# Patient Record
Sex: Male | Born: 1995 | Race: Black or African American | Hispanic: No | Smoking: Current every day smoker
Health system: Southern US, Community
[De-identification: ages and names within clinical notes are randomized; demographics above are authoritative.]

## PROBLEM LIST (undated history)

## (undated) ENCOUNTER — Ambulatory Visit: Payer: Self-pay | Source: Home / Self Care

## (undated) DIAGNOSIS — R569 Unspecified convulsions: Secondary | ICD-10-CM

---
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
## 2013-02-26 ENCOUNTER — Emergency Department (INDEPENDENT_AMBULATORY_CARE_PROVIDER_SITE_OTHER)
Admission: EM | Admit: 2013-02-26 | Discharge: 2013-02-26 | Disposition: A | Discharge status: Home / Self Care | Payer: Medicaid Other | Source: Home / Self Care | Attending: Family Medicine | Admitting: Family Medicine

## 2013-02-26 ENCOUNTER — Encounter (HOSPITAL_COMMUNITY): Payer: Self-pay | Admitting: Emergency Medicine

## 2013-02-26 ENCOUNTER — Emergency Department (INDEPENDENT_AMBULATORY_CARE_PROVIDER_SITE_OTHER): Payer: Medicaid Other

## 2013-02-26 DIAGNOSIS — H6120 Impacted cerumen, unspecified ear: Secondary | ICD-10-CM

## 2013-02-26 DIAGNOSIS — J111 Influenza due to unidentified influenza virus with other respiratory manifestations: Secondary | ICD-10-CM

## 2013-02-26 DIAGNOSIS — H6121 Impacted cerumen, right ear: Secondary | ICD-10-CM

## 2013-02-26 DIAGNOSIS — R69 Illness, unspecified: Principal | ICD-10-CM

## 2013-02-26 MED ORDER — IPRATROPIUM BROMIDE 0.06 % NA SOLN: 2.0000 | Freq: Four times a day (QID) | NASAL | Status: DC

## 2013-02-26 MED ORDER — HYDROCODONE-ACETAMINOPHEN 5-325 MG PO TABS: 0.5000 | ORAL_TABLET | Freq: Every evening | ORAL | Status: DC | PRN

## 2013-02-26 MED ORDER — ONDANSETRON HCL 8 MG PO TABS: 8.0000 mg | ORAL_TABLET | Freq: Three times a day (TID) | ORAL | Status: DC | PRN

## 2013-02-26 NOTE — ED Provider Notes (Signed)
Christian Arias is a 18 y.o. male who presents to Urgent Care today for 5-7 days of congestion cough nausea vomiting diarrhea fevers and chills and body aches. He has tried over-the-counter medications which seem to help. He is eating and drinking well.   History reviewed. No pertinent past medical history. History  Substance Use Topics  . Smoking status: Current Every Day Smoker  . Smokeless tobacco: Not on file  . Alcohol Use: No   ROS as above Medications reviewed. No current facility-administered medications for this encounter.   Current Outpatient Prescriptions  Medication Sig Dispense Refill  . methylphenidate (CONCERTA) 27 MG CR tablet Take 27 mg by mouth every morning.      Marland Kitchen. HYDROcodone-acetaminophen (NORCO/VICODIN) 5-325 MG per tablet Take 0.5 tablets by mouth at bedtime as needed (cough).  6 tablet  0  . ipratropium (ATROVENT) 0.06 % nasal spray Place 2 sprays into both nostrils 4 (four) times daily.  15 mL  1  . ondansetron (ZOFRAN) 8 MG tablet Take 1 tablet (8 mg total) by mouth every 8 (eight) hours as needed for nausea or vomiting.  20 tablet  0    Exam:  BP 108/71  Pulse 73  Temp(Src) 98.3 F (36.8 C) (Oral)  Resp 18  Ht 5' 8.25" (1.734 m)  Wt 136 lb (61.689 kg)  BMI 20.52 kg/m2  SpO2 96% Gen: Well NAD HEENT: EOMI,  MMM history of rash with cobblestoning. Left tympanic membrane is normal appearing. Right is occluded with cerumen. Lungs: Normal work of breathing. CTABL Heart: RRR no MRG Abd: NABS, Soft. NT, ND Exts: Non edematous BL  LE, warm and well perfused.   The majority of the right cerumen was removed however patient continued to have some cerumen impaction. He felt somewhat better.  No results found for this or any previous visit (from the past 24 hour(s)). Dg Chest 2 View  02/26/2013   CLINICAL DATA:  Cough, congestion, fever  EXAM: CHEST  2 VIEW  COMPARISON:  04/16/2012  FINDINGS: The heart size and mediastinal contours are within normal limits.  Both lungs are clear. The visualized skeletal structures are unremarkable.  IMPRESSION: No active cardiopulmonary disease.   Electronically Signed   By: Ruel Favorsrevor  Shick M.D.   On: 02/26/2013 11:59    Assessment and Plan: 18 y.o. male with influenza-like illness and cerumen impaction. Plan treatment with hydrocodone-based cough medication, Atrovent nasal spray. Additionally recommend Debrox ear drops as needed. Follow up with primary care provider.  Discussed warning signs or symptoms. Please see discharge instructions. Patient expresses understanding.    Rodolph BongEvan S Sevin Farone, MD 02/26/13 [phone]

## 2013-02-26 NOTE — ED Notes (Signed)
C/o flu like syx x past 5 days; cough, congestion, vomiting, loose stools; NAD; minimal relief w OTC medications

## 2013-02-26 NOTE — Discharge Instructions (Signed)
Thank you for coming in today. Take up to 2 Aleve twice daily for pain fevers chills body aches or sore throat or headache. Use codeine containing cough medication especially at bedtime. Use Atrovent nasal spray. Use Zofran as needed for vomiting. Use Debrox eardrops to help get the wax out of your ear Call or go to the emergency room if you get worse, have trouble breathing, have chest pains, or palpitations.

## 2014-02-24 ENCOUNTER — Other Ambulatory Visit (HOSPITAL_COMMUNITY): Payer: Self-pay | Admitting: *Deleted

## 2014-02-24 ENCOUNTER — Ambulatory Visit (HOSPITAL_COMMUNITY)
Admission: RE | Admit: 2014-02-24 | Discharge: 2014-02-24 | Disposition: A | Discharge status: Home / Self Care | Payer: Medicaid Other | Source: Ambulatory Visit | Attending: *Deleted | Admitting: *Deleted

## 2014-02-24 DIAGNOSIS — M25561 Pain in right knee: Secondary | ICD-10-CM

## 2014-04-26 ENCOUNTER — Emergency Department (HOSPITAL_COMMUNITY)
Admission: EM | Admit: 2014-04-26 | Discharge: 2014-04-26 | Disposition: A | Discharge status: Home / Self Care | Payer: Medicaid Other | Attending: Emergency Medicine | Admitting: Emergency Medicine

## 2014-04-26 ENCOUNTER — Encounter (HOSPITAL_COMMUNITY): Payer: Self-pay

## 2014-04-26 DIAGNOSIS — J320 Chronic maxillary sinusitis: Secondary | ICD-10-CM | POA: Insufficient documentation

## 2014-04-26 DIAGNOSIS — M791 Myalgia: Secondary | ICD-10-CM | POA: Diagnosis not present

## 2014-04-26 DIAGNOSIS — Z79899 Other long term (current) drug therapy: Secondary | ICD-10-CM | POA: Insufficient documentation

## 2014-04-26 DIAGNOSIS — Z720 Tobacco use: Secondary | ICD-10-CM | POA: Insufficient documentation

## 2014-04-26 DIAGNOSIS — M79641 Pain in right hand: Secondary | ICD-10-CM | POA: Diagnosis not present

## 2014-04-26 DIAGNOSIS — M1990 Unspecified osteoarthritis, unspecified site: Secondary | ICD-10-CM | POA: Insufficient documentation

## 2014-04-26 DIAGNOSIS — M779 Enthesopathy, unspecified: Secondary | ICD-10-CM

## 2014-04-26 DIAGNOSIS — R51 Headache: Secondary | ICD-10-CM | POA: Diagnosis present

## 2014-04-26 DIAGNOSIS — H9191 Unspecified hearing loss, right ear: Secondary | ICD-10-CM | POA: Insufficient documentation

## 2014-04-26 DIAGNOSIS — H9201 Otalgia, right ear: Secondary | ICD-10-CM | POA: Diagnosis not present

## 2014-04-26 MED ORDER — GUAIFENESIN-CODEINE 100-10 MG/5ML PO SYRP: 10.0000 mL | ORAL_SOLUTION | Freq: Three times a day (TID) | ORAL | Status: DC | PRN

## 2014-04-26 MED ORDER — PSEUDOEPHEDRINE HCL 60 MG PO TABS: 60.0000 mg | ORAL_TABLET | Freq: Four times a day (QID) | ORAL | Status: DC | PRN

## 2014-04-26 MED ORDER — NAPROXEN 500 MG PO TABS: 500.0000 mg | ORAL_TABLET | Freq: Two times a day (BID) | ORAL | Status: DC

## 2014-04-26 NOTE — ED Notes (Signed)
Can't hear out of right ear, sinus congestion, and right hand pain w/o injury. "I can't work with this"

## 2014-04-26 NOTE — Discharge Instructions (Signed)
Repetitive Strain Injuries  Repetitive strain injuries (RSIs) result from overuse or misuse of soft tissues including muscles, tendons, or nerves. Tendons are the cord-like structures that attach muscles to bones. RSIs can affect almost any part of the body. However, RSIs are most common in the arms (thumbs, wrists, elbows, shoulders) and legs (ankles, knees). Common medical conditions that are often caused by repetitive strain include carpal tunnel syndrome, tennis or golfer's elbow, bursitis, and tendonitis. If RSIs are treated early, and therepeated activity is reduced or removed, the severity and length of your problems can usually be reduced. RSIs are also called cumulative trauma disorders (CTD).   CAUSES   Many RSIs occur due to repeating the same activity at work over weeks or months without sufficient rest, such as prolonged typing. RSIs also commonly occur when a hobby or sport is done repeatedly without sufficient rest. RSIs can also occur due to repeated strain or stress on a body part in someone who has one or more risk factors for RSIs.  RISK FACTORS  Workplace risk factors   Frequent computer use, especially if your workstation is not adjusted for your body type.   Infrequent rest breaks.   Working in a high-pressure environment.   Working at a fast pace.   Repeating the same motion, such as frequent typing.   Working in an awkward position or holding the same position for a long time.   Forceful movements such as lifting, pulling, or pushing.   Vibration caused by using power tools.   Working in cold temperatures.   Job stress.  Personal risk factors   Poor posture.   Being loose-jointed.   Not exercising regularly.   Being overweight.   Arthritis, diabetes, thyroid problems, or other long-term (chronic)medical conditions.   Vitamin deficiencies.   Keeping your fingernails long.   An unhealthy, stressful, or inactive lifestyle.   Not sleeping well.  SYMPTOMS   Symptoms often  begin at work but become more noticeable after the repeated stress has ended. For example, you may develop fatigue or soreness in your wrist while typingat work, and at night you may develop numbness and tingling in your fingers. Common symptoms include:    Burning, shooting, or aching pain, especially in the fingers, palms, wrists, forearms, or shoulders.   Tenderness.   Swelling.   Tingling, numbness, or loss of feeling.   Pain with certain activities, such as turning a doorknob or reaching above your head.   Weakness, heaviness, or loss of coordination in yourhand.   Muscle spasms or tightness.  In some cases, symptoms can become so intense that it is difficult to perform everyday tasks. Symptoms that do not improve with rest may indicate a more serious condition.   DIAGNOSIS   Your caregiver may determine the type ofRSI you have based on your medical evaluation and a description of your activities.   TREATMENT   Treatment depends on the severity and type of RSI you have. Your caregiver may recommend rest for the affected body part, medicines, and physical or occupational therapy to reduce pain, swelling, and soreness. Discuss the activities you do repeatedly with your caregiver. Your caregiver can help you decide whether you need to change your activities. An RSI may take months or years to heal, especially if the affected body part gets insufficient rest. In some cases, such as severe carpal tunnel syndrome, surgery may be recommended.  PREVENTION   Talk with your supervisor to make sure you have the proper equipment   cushion in the curve of your lower back.  Shoulders and arms relaxed and at your sides.  Neck relaxed and not bent forwards or backwards.  Your desk and computer workstation  properly adjusted to your body type.  Your chair adjusted so there is no excess pressure on the back of your thighs.  The keyboard resting above your thighs. You should be able to reach the keys with your elbows at your side, bent at a right angle. Your arms should be supported on forearm rests, with your forearms parallel to the ground.  The computer mouse within easy reach.  The monitor directly in front of you, so that your eyes are aligned with the top of the screen. The screen should be about 15 to 25 inches from your eyes.  While typing, keep your wrist straight, in a neutral position. Move your entire arm when you move your mouse or when typing hard-to-reach keys.  Only use your computer as much as you need to for work. Do not use it during breaks.  Take breaks often from any repeated activity. Alternate with another task which requires you to use different muscles, or rest at least once every hour.  Change positions regularly. If you spend a lot of time sitting, get up, walk around, and stretch.  Do not hold pens or pencils tightly when writing.  Exercise regularly.  Maintain a normal weight.  Eat a diet with plenty of vegetables, whole grains, and fruit.  Get sufficient, restful sleep. HOME CARE INSTRUCTIONS  If your caregiver prescribed medicine to help reduce swelling, take it as directed.  Only take over-the-counter or prescription medicines for pain, discomfort, or fever as directed by your caregiver.  Reduce, and if needed, stopthe activities that are causing your problems until you have no further symptoms.If your symptoms are work-related, you may need to talk to your supervisor about changing your activities.  When symptoms develop, put ice or a cold pack on the aching area.  Put ice in a plastic bag.  Place a towel between your skin and the bag.  Leave the ice on for 15-20 minutes.  If you were given a splint to keep your wrist from bending, wear it as  instructed. It is important to wear the splint at night. Use the splint for as long as your caregiver recommends. SEEK MEDICAL CARE IF:  You develop new problems.  Your problems do not get better with medicine. MAKE SURE YOU:  Understand these instructions.  Will watch your condition.  Will get help right away if you are not doing well or get worse. Document Released: 01/27/2002 Document Revised: 08/08/2011 Document Reviewed: 03/30/2011 [facility] Patient Information 2015 [place], [place]. This information is not intended to replace advice given to you by your health care provider. Make sure you discuss any questions you have with your health care provider.  Sinusitis Sinusitis is redness, soreness, and puffiness (inflammation) of the air pockets in the bones of your face (sinuses). The redness, soreness, and puffiness can cause air and mucus to get trapped in your sinuses. This can allow germs to grow and cause an infection.  HOME CARE   Drink enough fluids to keep your pee (urine) clear or pale yellow.  Use a humidifier in your home.  Run a hot shower to create steam in the bathroom. Sit in the bathroom with the door closed. Breathe in the steam 3-4 times a day.  Put a warm, moist washcloth on your face 3-4 times a  day, or as told by your doctor.  Use salt water sprays (saline sprays) to wet the thick fluid in your nose. This can help the sinuses drain.  Only take medicine as told by your doctor. GET HELP RIGHT AWAY IF:   Your pain gets worse.  You have very bad headaches.  You are sick to your stomach (nauseous).  You throw up (vomit).  You are very sleepy (drowsy) all the time.  Your face is puffy (swollen).  Your vision changes.  You have a stiff neck.  You have trouble breathing. MAKE SURE YOU:   Understand these instructions.  Will watch your condition.  Will get help right away if you are not doing well or get worse. Document Released: 07/26/2007 Document  Revised: 11/01/2011 Document Reviewed: 09/12/2011 [facility] Patient Information 2015 [place], [place]. This information is not intended to replace advice given to you by your health care provider. Make sure you discuss any questions you have with your health care provider.

## 2014-04-28 NOTE — ED Provider Notes (Signed)
CSN: [id number]     Arrival date & time 04/26/14  2155 History   First MD Initiated Contact with Patient 04/26/14 2224     Chief Complaint  Patient presents with  . Otalgia  . Facial Pain  . Hand Pain     (Consider location/radiation/quality/duration/timing/severity/associated sxs/prior Treatment) HPI   Christian Arias is a 19 y.o. male who presents to the Emergency Department complaining of ear pain, nasla congestion, sinus pressure and generalized body aches.  States his symptoms have been present for 3-4 days.  He describes a "pressure" to his face and difficulty breathing from his nose.  He also states that he has pain to his right ear and decreased hearing.  He denies fever, chills, cough, shortness or breath or dizziness.   He also c/o pain to his right hand for several days.  States that he uses his hands frequently at his job.  Pain is worse with gripping.  He denies known injury, swelling, numbness or discoloration.      History reviewed. No pertinent past medical history. History reviewed. No pertinent past surgical history. No family history on file. History  Substance Use Topics  . Smoking status: Current Every Day Smoker -- 0.50 packs/day    Types: Cigarettes  . Smokeless tobacco: Not on file  . Alcohol Use: No    Review of Systems  Constitutional: Negative for fever, chills, activity change and appetite change.  HENT: Positive for congestion, ear pain, hearing loss, rhinorrhea and sinus pressure. Negative for facial swelling, sore throat and trouble swallowing.   Eyes: Negative for visual disturbance.  Respiratory: Negative for cough, shortness of breath, wheezing and stridor.   Gastrointestinal: Negative for nausea, vomiting and abdominal pain.  Musculoskeletal: Positive for myalgias and arthralgias (right hand pain). Negative for neck pain and neck stiffness.  Skin: Negative.   Neurological: Negative for dizziness, weakness, numbness and headaches.   Hematological: Negative for adenopathy.  Psychiatric/Behavioral: Negative for confusion.  All other systems reviewed and are negative.     Allergies  Review of patient's allergies indicates no known allergies.  Home Medications   Prior to Admission medications   Medication Sig Start Date End Date Taking? Authorizing Provider  guaiFENesin-codeine (ROBITUSSIN AC) 100-10 MG/5ML syrup Take 10 mLs by mouth 3 (three) times daily as needed. 04/26/14   Tammi Reid Nawrot, PA-C  HYDROcodone-acetaminophen (NORCO/VICODIN) 5-325 MG per tablet Take 0.5 tablets by mouth at bedtime as needed (cough). 02/26/13   Rodolph Bong, MD  ipratropium (ATROVENT) 0.06 % nasal spray Place 2 sprays into both nostrils 4 (four) times daily. 02/26/13   Rodolph Bong, MD  methylphenidate (CONCERTA) 27 MG CR tablet Take 27 mg by mouth every morning.    Historical Provider, MD  naproxen (NAPROSYN) 500 MG tablet Take 1 tablet (500 mg total) by mouth 2 (two) times daily with a meal. 04/26/14   Tammi Macayla Ekdahl, PA-C  ondansetron (ZOFRAN) 8 MG tablet Take 1 tablet (8 mg total) by mouth every 8 (eight) hours as needed for nausea or vomiting. 02/26/13   Rodolph Bong, MD  pseudoephedrine (SUDAFED) 60 MG tablet Take 1 tablet (60 mg total) by mouth every 6 (six) hours as needed for congestion. 04/26/14   Tammi Dennison Mcdaid, PA-C   BP 110/56 mmHg  Pulse 60  Temp(Src) 98.3 F (36.8 C) (Oral)  Resp 24  Ht 5' 8.25" (1.734 m)  Wt 135 lb (61.236 kg)  BMI 20.37 kg/m2  SpO2 100% Physical Exam  Constitutional: He is oriented  to person, place, and time. He appears well-developed and well-nourished. No distress.  HENT:  Head: Normocephalic and atraumatic.  Right Ear: Tympanic membrane and ear canal normal.  Left Ear: Tympanic membrane and ear canal normal.  Nose: Mucosal edema and rhinorrhea present. Right sinus exhibits maxillary sinus tenderness. Left sinus exhibits maxillary sinus tenderness.  Mouth/Throat: Uvula is midline and mucous membranes are  normal. No trismus in the jaw. No uvula swelling. No oropharyngeal exudate, posterior oropharyngeal edema, posterior oropharyngeal erythema or tonsillar abscesses.  Eyes: Conjunctivae are normal.  Neck: Normal range of motion and phonation normal. Neck supple. No Brudzinski's sign and no Kernig's sign noted.  Cardiovascular: Normal rate, regular rhythm, normal heart sounds and intact distal pulses.   No murmur heard. Pulmonary/Chest: Effort normal and breath sounds normal. No respiratory distress. He has no wheezes. He has no rales.  Abdominal: Soft. He exhibits no distension. There is no tenderness. There is no rebound and no guarding.  Musculoskeletal: He exhibits no edema.  Tender along the dorsal right hand along the fourth and fifth metacarpals.  No erythema, edema or excessive warm.  Pt has full ROM of the fingers.  Distal sensation intact.  CR< 2 sec  Lymphadenopathy:    He has no cervical adenopathy.  Neurological: He is alert and oriented to person, place, and time. He exhibits normal muscle tone. Coordination normal.  Skin: Skin is warm and dry.  Nursing note and vitals reviewed.   ED Course  Procedures (including critical care time) Labs Review Labs Reviewed - No data to display  Imaging Review No results found.   EKG Interpretation None      MDM   Final diagnoses:  Maxillary sinusitis, unspecified chronicity  Tendonitis    Pt is well appearing, non-toxic.  No concerning sx for infectious process of the hand., NV intact. Sx's likely related to excessive use.  Pt agrees to symptomatic tx and close f/u with ortho if needed.      Severiano Gilbertammi Cheron Pasquarelli, PA-C 04/28/14 1949  Bethann BerkshireJoseph Zammit, MD 04/29/14 [phone]

## 2014-05-09 ENCOUNTER — Encounter (HOSPITAL_COMMUNITY): Payer: Self-pay | Admitting: Cardiology

## 2014-05-09 ENCOUNTER — Emergency Department (HOSPITAL_COMMUNITY)
Admission: EM | Admit: 2014-05-09 | Discharge: 2014-05-09 | Disposition: A | Discharge status: Home / Self Care | Payer: Medicaid Other | Attending: Emergency Medicine | Admitting: Emergency Medicine

## 2014-05-09 DIAGNOSIS — R197 Diarrhea, unspecified: Secondary | ICD-10-CM | POA: Diagnosis not present

## 2014-05-09 DIAGNOSIS — Z79899 Other long term (current) drug therapy: Secondary | ICD-10-CM | POA: Diagnosis not present

## 2014-05-09 DIAGNOSIS — R112 Nausea with vomiting, unspecified: Secondary | ICD-10-CM | POA: Diagnosis not present

## 2014-05-09 DIAGNOSIS — Z720 Tobacco use: Secondary | ICD-10-CM | POA: Diagnosis not present

## 2014-05-09 DIAGNOSIS — R1110 Vomiting, unspecified: Secondary | ICD-10-CM | POA: Diagnosis present

## 2014-05-09 LAB — BASIC METABOLIC PANEL
Anion gap: 11 (ref 5–15)
BUN: 20 mg/dL (ref 6–23)
CHLORIDE: 105 mmol/L (ref 96–112)
CO2: 24 mmol/L (ref 19–32)
CREATININE: 0.86 mg/dL (ref 0.50–1.35)
Calcium: 9.6 mg/dL (ref 8.4–10.5)
GFR calc Af Amer: >90 mL/min (ref >90)
Glucose, Bld: 81 mg/dL (ref 70–99)
POTASSIUM: 4.3 mmol/L (ref 3.5–5.1)
SODIUM: 140 mmol/L (ref 135–145)

## 2014-05-09 LAB — CBC WITH DIFFERENTIAL/PLATELET
BAND NEUTROPHILS: 0 % (ref 0–10)
BASOS ABS: 0.1 10*3/uL (ref 0.0–0.1)
BLASTS: 0 %
Basophils Relative: 1 % (ref 0–1)
Eosinophils Absolute: 0 10*3/uL (ref 0.0–0.7)
Eosinophils Relative: 0 % (ref 0–5)
HEMATOCRIT: 44.4 % (ref 39.0–52.0)
HEMOGLOBIN: 15.7 g/dL (ref 13.0–17.0)
Lymphocytes Relative: 4 % — ABNORMAL LOW (ref 12–46)
Lymphs Abs: 0.5 10*3/uL — ABNORMAL LOW (ref 0.7–4.0)
MCH: 31.6 pg (ref 26.0–34.0)
MCHC: 35.4 g/dL (ref 30.0–36.0)
MCV: 89.3 fL (ref 78.0–100.0)
MONOS PCT: 2 % — AB (ref 3–12)
MYELOCYTES: 0 %
Metamyelocytes Relative: 0 %
Monocytes Absolute: 0.2 10*3/uL (ref 0.1–1.0)
Neutro Abs: 10.7 10*3/uL — ABNORMAL HIGH (ref 1.7–7.7)
Neutrophils Relative %: 93 % — ABNORMAL HIGH (ref 43–77)
PROMYELOCYTES ABS: 0 %
Platelets: 234 10*3/uL (ref 150–400)
RBC: 4.97 MIL/uL (ref 4.22–5.81)
RDW: 13.2 % (ref 11.5–15.5)
WBC: 11.5 10*3/uL — ABNORMAL HIGH (ref 4.0–10.5)
nRBC: 0 /100 WBC

## 2014-05-09 MED ORDER — SODIUM CHLORIDE 0.9 % IV BOLUS (SEPSIS)
1000.0000 mL | Freq: Once | INTRAVENOUS | Status: AC
  Administered 2014-05-09: 1000 mL via INTRAVENOUS

## 2014-05-09 MED ORDER — PROMETHAZINE HCL 25 MG PO TABS: 25.0000 mg | ORAL_TABLET | Freq: Four times a day (QID) | ORAL | Status: DC | PRN

## 2014-05-09 MED ORDER — ONDANSETRON 4 MG PO TBDP
4.0000 mg | ORAL_TABLET | Freq: Once | ORAL | Status: AC
  Administered 2014-05-09: 4 mg via ORAL

## 2014-05-09 MED ORDER — NAPROXEN 500 MG PO TABS: 500.0000 mg | ORAL_TABLET | Freq: Two times a day (BID) | ORAL | Status: DC

## 2014-05-09 MED ORDER — ONDANSETRON HCL 4 MG/2ML IJ SOLN
4.0000 mg | Freq: Once | INTRAMUSCULAR | Status: AC
  Administered 2014-05-09: 4 mg via INTRAVENOUS
  Filled 2014-05-09: qty 2

## 2014-05-09 MED ORDER — ONDANSETRON 4 MG PO TBDP
ORAL_TABLET | ORAL | Status: AC
  Filled 2014-05-09: qty 1

## 2014-05-09 MED ORDER — KETOROLAC TROMETHAMINE 30 MG/ML IJ SOLN
30.0000 mg | Freq: Once | INTRAMUSCULAR | Status: AC
  Administered 2014-05-09: 30 mg via INTRAVENOUS
  Filled 2014-05-09: qty 1

## 2014-05-09 NOTE — Discharge Instructions (Signed)
Please call your doctor for a followup appointment within 24-48 hours. When you talk to your doctor please let them know that you were seen in the emergency department and have them acquire all of your records so that they can discuss the findings with you and formulate a treatment plan to fully care for your new and ongoing problems. ° °[facility] Doctor List ° ° ° °Edward Hawkins MD. Specialty: Pulmonary Disease Contact information: [address]  °[address]  °[place] [address]  °[phone]  ° °Margaret Simpson, MD. Specialty: Family Medicine Contact information: [address]  °[place] [address]  °[phone]  ° °Scott Luking, MD. Specialty: Family Medicine Contact information: [address]  °[address]  °[place] [address]  °[phone]  ° °Tesfaye Fanta, MD Specialty: Internal Medicine Contact information: [address]  °[place] [address]  °[phone]  ° °Zach Hall, MD. Specialty: Internal Medicine Contact information: [address]  °[place] [address]  °[phone]  ° °Angus Mcinnis, MD. Specialty: Family Medicine Contact information: [address]  °[place] [address]  °[phone]  ° °Stephen Knowlton, MD. Specialty: Family Medicine Contact information: [address]  °[address]  °[place] [address]  °[phone]  ° °Roy Fagan, MD. Specialty: Internal Medicine Contact information: [address]  °[address]  °[place] [address]  °[phone]  ° ° °

## 2014-05-09 NOTE — ED Notes (Signed)
Vomiting since 11 am.  States he sees blood in his vomit.

## 2014-05-09 NOTE — ED Notes (Signed)
Pt alert & oriented x4, stable gait. Patient given discharge instructions, paperwork & prescription(s). Patient  instructed to stop at the registration desk to finish any additional paperwork. Patient verbalized understanding. Pt left department w/ no further questions. 

## 2014-05-09 NOTE — ED Provider Notes (Signed)
CSN: [id number]     Arrival date & time 05/09/14  1812 History   First MD Initiated Contact with Patient 05/09/14 2044     Chief Complaint  Patient presents with  . Emesis     (Consider location/radiation/quality/duration/timing/severity/associated sxs/prior Treatment) HPI Comments: The patient is a 19 year old male, no significant past medical history, presents with approximately 3 days of loose and watery stools and one day of vomiting which started this morning. He has vomited approximately 6 times, nothing makes this better or worse, no associated fever, he does have associated abdominal cramping which is diffuse and nonfocal. The diarrhea has now stopped, he has no peripheral edema, no fevers chills coughing shortness of breath or back pain. 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 He denies any travel, recent antibiotics or other sick contacts.  Patient is a 19 y.o. male presenting with vomiting. The history is provided by the patient.  Emesis   History reviewed. No pertinent past medical history. History reviewed. No pertinent past surgical history. History reviewed. No pertinent family history. History  Substance Use Topics  . Smoking status: Current Every Day Smoker -- 0.50 packs/day    Types: Cigarettes  . Smokeless tobacco: Not on file  . Alcohol Use: No    Review of Systems  Gastrointestinal: Positive for vomiting.  All other systems reviewed and are negative.     Allergies  Review of patient's allergies indicates no known allergies.  Home Medications   Prior to Admission medications   Medication Sig Start Date End Date Taking? Authorizing Provider  ibuprofen (ADVIL,MOTRIN) 800 MG tablet Take 800 mg by mouth daily as needed for moderate pain.   Yes Historical Provider, MD  tiZANidine (ZANAFLEX) 4 MG tablet Take 4 mg by mouth every 6 (six) hours as needed for muscle spasms.   Yes  Historical Provider, MD  guaiFENesin-codeine (ROBITUSSIN AC) 100-10 MG/5ML syrup Take 10 mLs by mouth 3 (three) times daily as needed. Patient not taking: Reported on 05/09/2014 04/26/14   Severiano Gilbert, PA-C  HYDROcodone-acetaminophen (NORCO/VICODIN) 5-325 MG per tablet Take 0.5 tablets by mouth at bedtime as needed (cough). Patient not taking: Reported on 05/09/2014 02/26/13   Rodolph Bong, MD  ipratropium (ATROVENT) 0.06 % nasal spray Place 2 sprays into both nostrils 4 (four) times daily. Patient not taking: Reported on 05/09/2014 02/26/13   Rodolph Bong, MD  naproxen (NAPROSYN) 500 MG tablet Take 1 tablet (500 mg total) by mouth 2 (two) times daily with a meal. 05/09/14   Eber Hong, MD  ondansetron (ZOFRAN) 8 MG tablet Take 1 tablet (8 mg total) by mouth every 8 (eight) hours as needed for nausea or vomiting. Patient not taking: Reported on 05/09/2014 02/26/13   Rodolph Bong, MD  promethazine (PHENERGAN) 25 MG tablet Take 1 tablet (25 mg total) by mouth every 6 (six) hours as needed for nausea or vomiting. 05/09/14   Eber Hong, MD  pseudoephedrine (SUDAFED) 60 MG tablet Take 1 tablet (60 mg total) by mouth every 6 (six) hours as needed for congestion. Patient not taking: Reported on 05/09/2014 04/26/14   Tammi Triplett, PA-C   BP 116/68 mmHg  Pulse 65  Temp(Src) 98.3 F (36.8 C) (Oral)  Resp 14  Ht  (1.727 m)  Wt 135 lb (61.236 kg)  BMI 20.53 kg/m2  SpO2 100% Physical Exam  Constitutional: He appears well-developed and well-nourished. No distress.  HENT:  Head: Normocephalic and atraumatic.  Mouth/Throat: Oropharynx is clear and moist. No oropharyngeal exudate.  Eyes: Conjunctivae and EOM are normal. Pupils are equal, round, and reactive to light. Right eye exhibits no discharge. Left eye exhibits no discharge. No scleral icterus.  Neck: Normal range of motion. Neck supple. No JVD present. No thyromegaly present.  Cardiovascular: Normal rate, regular rhythm, normal heart sounds and  intact distal pulses.  Exam reveals no gallop and no friction rub.   No murmur heard. Pulmonary/Chest: Effort normal and breath sounds normal. No respiratory distress. He has no wheezes. He has no rales.  Abdominal: Soft. Bowel sounds are normal. He exhibits no distension and no mass. There is tenderness.  Musculoskeletal: Normal range of motion. He exhibits no edema or tenderness.  Lymphadenopathy:    He has no cervical adenopathy.  Neurological: He is alert. Coordination normal.  Skin: Skin is warm and dry. No rash noted. No erythema.  Psychiatric: He has a normal mood and affect. His behavior is normal.  Nursing note and vitals reviewed.   ED Course  Procedures (including critical care time) Labs Review Labs Reviewed  CBC WITH DIFFERENTIAL/PLATELET - Abnormal; Notable for the following:    WBC 11.5 (*)    Neutrophils Relative % 93 (*)    Lymphocytes Relative 4 (*)    Monocytes Relative 2 (*)    Neutro Abs 10.7 (*)    Lymphs Abs 0.5 (*)    All other components within normal limits  BASIC METABOLIC PANEL    Imaging Review No results found.    MDM   Final diagnoses:  Nausea vomiting and diarrhea    Well appaering other than some dehydradtion - likely norovirus.  CBC with mild leukocytosis, BMP normal - VS normal.  toradol zofran fluids  Improved, ready for d/c.  Labs reassuring, mild WBC elevation.    Eber HongBrian Marquese Burkland, MD 05/09/14 [phone]

## 2015-11-12 ENCOUNTER — Encounter (HOSPITAL_COMMUNITY): Payer: Self-pay | Admitting: *Deleted

## 2015-11-12 ENCOUNTER — Emergency Department (HOSPITAL_COMMUNITY)
Admission: EM | Admit: 2015-11-12 | Discharge: 2015-11-12 | Disposition: A | Discharge status: Home / Self Care | Payer: Medicaid Other | Attending: Emergency Medicine | Admitting: Emergency Medicine

## 2015-11-12 ENCOUNTER — Emergency Department (HOSPITAL_COMMUNITY): Payer: Medicaid Other

## 2015-11-12 DIAGNOSIS — J9801 Acute bronchospasm: Secondary | ICD-10-CM

## 2015-11-12 DIAGNOSIS — J069 Acute upper respiratory infection, unspecified: Secondary | ICD-10-CM | POA: Insufficient documentation

## 2015-11-12 DIAGNOSIS — Z79899 Other long term (current) drug therapy: Secondary | ICD-10-CM | POA: Insufficient documentation

## 2015-11-12 DIAGNOSIS — F17210 Nicotine dependence, cigarettes, uncomplicated: Secondary | ICD-10-CM | POA: Insufficient documentation

## 2015-11-12 MED ORDER — KETOROLAC TROMETHAMINE 30 MG/ML IJ SOLN
30.0000 mg | Freq: Once | INTRAMUSCULAR | Status: AC
  Administered 2015-11-12: 30 mg via INTRAVENOUS
  Filled 2015-11-12: qty 1

## 2015-11-12 MED ORDER — PREDNISONE 20 MG PO TABS: 60.0000 mg | ORAL_TABLET | Freq: Every day | ORAL | 0 refills | Status: DC

## 2015-11-12 MED ORDER — ONDANSETRON 4 MG PO TBDP: 4.0000 mg | ORAL_TABLET | Freq: Three times a day (TID) | ORAL | 0 refills | Status: DC | PRN

## 2015-11-12 MED ORDER — ALBUTEROL SULFATE HFA 108 (90 BASE) MCG/ACT IN AERS
2.0000 | INHALATION_SPRAY | Freq: Once | RESPIRATORY_TRACT | Status: AC
  Administered 2015-11-12: 2 via RESPIRATORY_TRACT
  Filled 2015-11-12: qty 6.7

## 2015-11-12 MED ORDER — BENZONATATE 100 MG PO CAPS: 100.0000 mg | ORAL_CAPSULE | Freq: Three times a day (TID) | ORAL | 0 refills | Status: DC | PRN

## 2015-11-12 MED ORDER — SODIUM CHLORIDE 0.9 % IV BOLUS (SEPSIS)
1000.0000 mL | Freq: Once | INTRAVENOUS | Status: AC
  Administered 2015-11-12: 1000 mL via INTRAVENOUS

## 2015-11-12 MED ORDER — ONDANSETRON HCL 4 MG/2ML IJ SOLN
4.0000 mg | Freq: Once | INTRAMUSCULAR | Status: AC
  Administered 2015-11-12: 4 mg via INTRAVENOUS
  Filled 2015-11-12: qty 2

## 2015-11-12 NOTE — ED Triage Notes (Signed)
Pt c/o chest tightness and shortness of breath x 3 days. Wheezing noted bilaterally. Pt speaking in full sentences. Pt given 125mg  solumedrol,10mg  albuterol, and 0.5mg  atrovent by EMS

## 2015-11-12 NOTE — Discharge Instructions (Addendum)
You may alternate between Tylenol 1000 mg every 6 hours as needed for fever and pain and ibuprofen 800 mg every 8 hours as needed for fever and pain. Please rest and drink plenty of fluids. Your x-ray showed no pneumonia. I do not feel you need to be on antibiotics. This is likely a viral illness. You may use your inhaler 2-4 puffs every 2-4 hours as needed for shortness of breath, wheezing.   To find a primary care or specialty doctor please call [phone] or [phone] to access "[place] Find a Doctor Service."  You may also go on the [facility] website at [email or web address]  There are also multiple [facility] and [facility] practices throughout the [place] that are frequently accepting new patients. You may find a clinic that is close to your home and contact them.  [facility] -  [address] [place] [phone]  [facility] in [place] (also locations in [place] and [place]) -  [address] [place] [address] [phone]  [facility] -  [address] [place] [address] [phone]

## 2015-11-12 NOTE — ED Provider Notes (Signed)
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 Has also had nausea but no vomiting or diarrhea. Denies sick contacts or recent travel. States he started having sharp central chest pain today without radiation that lasted for several seconds and then resolve. It concerned him because he had a family history of CAD; 911. With EMS, patient received 10 mg of albuterol, 0.5 mg of Atrovent, 125 mg of IV Solu-Medrol.  He reports he is already feeling better.  ROS: See HPI Constitutional:  fever  Eyes: no drainage  ENT: no runny nose   Cardiovascular:   chest pain  Resp:  SOB  GI: no vomiting GU: no dysuria Integumentary: no rash  Allergy: no hives  Musculoskeletal: no leg swelling  Neurological: no slurred speech ROS otherwise negative  PAST MEDICAL HISTORY/PAST SURGICAL HISTORY:  History reviewed. No pertinent past medical history.  MEDICATIONS:  Prior to Admission medications   Medication Sig Start Date End Date Taking? Authorizing Provider  guaiFENesin-codeine (ROBITUSSIN AC) 100-10 MG/5ML syrup Take 10 mLs by mouth 3 (three) times daily as needed. Patient not taking: Reported on 05/09/2014 04/26/14   Tammy Triplett, PA-C  HYDROcodone-acetaminophen (NORCO/VICODIN) 5-325 MG per tablet Take 0.5 tablets by mouth at bedtime as needed (cough). Patient not taking: Reported on 05/09/2014 02/26/13   Rodolph BongEvan S Corey, MD  ibuprofen (ADVIL,MOTRIN) 800 MG tablet Take 800 mg by mouth daily as needed for moderate pain.    Historical Provider, MD  ipratropium (ATROVENT) 0.06 % nasal spray Place 2 sprays into both nostrils 4 (four) times  daily. Patient not taking: Reported on 05/09/2014 02/26/13   Rodolph BongEvan S Corey, MD  naproxen (NAPROSYN) 500 MG tablet Take 1 tablet (500 mg total) by mouth 2 (two) times daily with a meal. 05/09/14   Eber HongBrian Miller, MD  ondansetron (ZOFRAN) 8 MG tablet Take 1 tablet (8 mg total) by mouth every 8 (eight) hours as needed for nausea or vomiting. Patient not taking: Reported on 05/09/2014 02/26/13   Rodolph BongEvan S Corey, MD  promethazine (PHENERGAN) 25 MG tablet Take 1 tablet (25 mg total) by mouth every 6 (six) hours as needed for nausea or vomiting. 05/09/14   Eber HongBrian Miller, MD  pseudoephedrine (SUDAFED) 60 MG tablet Take 1 tablet (60 mg total) by mouth every 6 (six) hours as needed for congestion. Patient not taking: Reported on 05/09/2014 04/26/14   Tammy Triplett, PA-C  tiZANidine (ZANAFLEX) 4 MG tablet Take 4 mg by mouth every 6 (six) hours as needed for muscle spasms.    Historical Provider, MD    ALLERGIES:  No Known Allergies  SOCIAL HISTORY:  Social History  Substance Use Topics  . Smoking status: Current Every Day Smoker    Packs/day: 0.50    Types: Cigarettes  . Smokeless tobacco: Never Used  . Alcohol use No    FAMILY HISTORY: No family history on file.  EXAM: BP 120/63 (BP Location: Right Arm)   Pulse 105   Temp 98.7 F (37.1 C) (Oral)   Resp 18   SpO2 100%  CONSTITUTIONAL: Alert and oriented and responds appropriately to questions. Well-appearing; well-nourished HEAD: Normocephalic EYES: Conjunctivae clear, PERRL ENT: normal nose; no rhinorrhea; moist mucous membranes;  No pharyngeal erythema or petechiae, no tonsillar hypertrophy or exudate, no uvular deviation, no trismus or drooling, normal phonation, no stridor, no dental caries present, no drainable dental abscess noted, no Ludwig's angina, tongue sits flat in the bottom of the mouth, no angioedema, no facial erythema or warmth, no facial swelling NECK: Supple, no meningismus, no LAD  CARD: RRR; S1 and S2 appreciated; no murmurs, no  clicks, no rubs, no gallops RESP: Normal chest excursion without splinting or tachypnea; breath sounds clear and equal bilaterally; no wheezes, no rhonchi, no rales, no hypoxia or respiratory distress, speaking full sentences ABD/GI: Normal bowel sounds; non-distended; soft, non-tender, no rebound, no guarding, no peritoneal signs BACK:  The back appears normal and is non-tender to palpation, there is no CVA tenderness EXT: Normal ROM in all joints; non-tender to palpation; no edema; normal capillary refill; no cyanosis, no calf tenderness or swelling    SKIN: Normal color for age and race; warm; no rash NEURO: Moves all extremities equally, sensation to light touch intact diffusely, cranial nerves II through XII intact PSYCH: The patient's mood and manner are appropriate. Grooming and personal hygiene are appropriate.  MEDICAL DECISION MAKING: Patient here with likely viral upper respiratory infection. He is having some bronchospasm that has improved with albuterol, Atrovent and Solu-Medrol with EMS. Reports previous history of bronchitis, bronchospasm. Does not use an albuterol inhaler at home. We'll treat symptomatically with Toradol, Zofran, IV fluids. EKG shows sinus arrhythmia with no other acute abnormality. He does not have risk factors for ACS other family history. Doubt PE or dissection. Will obtain a chest x-ray to evaluate for infiltrate.  Doubt meningitis or other life-threatening illness.  ED PROGRESS: Patient's lungs are now clear to auscultation. No hypoxia. Chest x-ray shows no infiltrate or edema. No pneumothorax. I do not feel he needs to be on antibiotics. We'll discharge her with albuterol inhaler. We'll discharge with prednisone burst, Tessalon Perles and instructions to alternate Tylenol and Motrin. We'll discharge with prescription for Zofran. We'll give him outpatient PCP follow-up. Discussed return precautions. He is comfortable with this plan.   At this time, I do not feel  there is any life-threatening condition present. I have reviewed and discussed all results (EKG, imaging, lab, urine as appropriate), exam findings with patient/family. I have reviewed nursing notes and appropriate previous records.  I feel the patient is safe to be discharged home without further emergent workup and can continue workup as an outpatient as needed. Discussed usual and customary return precautions. Patient/family verbalize understanding and are comfortable with this plan.  Outpatient follow-up has been provided. All questions have been answered.      EKG Interpretation  Date/Time:  Friday November 12 2015 05:46:06 EDT Ventricular Rate:  102 PR Interval:    QRS Duration: 89 QT Interval:  331 QTC Calculation: 432 R Axis:   86 Text Interpretation:  Fast sinus arrhythmia Right atrial enlargement Borderline T wave abnormalities No significant change since last tracing Confirmed by WARD,  DO, KRISTEN [phone]) on 11/12/2015 6:03:47 AM         Layla Maw Ward, DO 11/12/15 6213

## 2015-12-05 ENCOUNTER — Emergency Department (HOSPITAL_COMMUNITY): Payer: Medicaid Other

## 2015-12-05 ENCOUNTER — Emergency Department (HOSPITAL_COMMUNITY)
Admission: EM | Admit: 2015-12-05 | Discharge: 2015-12-05 | Disposition: A | Discharge status: Home / Self Care | Payer: Medicaid Other | Attending: Emergency Medicine | Admitting: Emergency Medicine

## 2015-12-05 ENCOUNTER — Encounter (HOSPITAL_COMMUNITY): Payer: Self-pay | Admitting: Emergency Medicine

## 2015-12-05 DIAGNOSIS — R079 Chest pain, unspecified: Secondary | ICD-10-CM | POA: Insufficient documentation

## 2015-12-05 DIAGNOSIS — Z5321 Procedure and treatment not carried out due to patient leaving prior to being seen by health care provider: Secondary | ICD-10-CM | POA: Insufficient documentation

## 2015-12-05 LAB — CBC
HCT: 38.9 % — ABNORMAL LOW (ref 39.0–52.0)
Hemoglobin: 13.5 g/dL (ref 13.0–17.0)
MCH: 31.2 pg (ref 26.0–34.0)
MCHC: 34.7 g/dL (ref 30.0–36.0)
MCV: 89.8 fL (ref 78.0–100.0)
PLATELETS: 232 10*3/uL (ref 150–400)
RBC: 4.33 MIL/uL (ref 4.22–5.81)
RDW: 13.8 % (ref 11.5–15.5)
WBC: 9.1 10*3/uL (ref 4.0–10.5)

## 2015-12-05 LAB — BASIC METABOLIC PANEL
Anion gap: 8 (ref 5–15)
BUN: 15 mg/dL (ref 6–20)
CALCIUM: 9.4 mg/dL (ref 8.9–10.3)
CHLORIDE: 103 mmol/L (ref 101–111)
CO2: 26 mmol/L (ref 22–32)
CREATININE: 0.98 mg/dL (ref 0.61–1.24)
Glucose, Bld: 87 mg/dL (ref 65–99)
Potassium: 3.3 mmol/L — ABNORMAL LOW (ref 3.5–5.1)
SODIUM: 137 mmol/L (ref 135–145)

## 2015-12-05 LAB — I-STAT TROPONIN, ED: TROPONIN I, POC: 0.01 ng/mL (ref 0.00–0.08)

## 2015-12-05 NOTE — ED Triage Notes (Signed)
Pt. \arrived with EMS from home reports intermittent central chest pain with mild SOB for 3 weeks , headache yesterday and anxiety attack today . Denies emesis or diaphoresis .

## 2015-12-05 NOTE — ED Notes (Signed)
Pt moved to room. Patient states "I just want this m----- f----- out my arm so I can go and those m----- f------ out there said I can't get it out until I get back here". MD made aware of pt's wishes as well as CN.

## 2016-03-15 ENCOUNTER — Encounter (HOSPITAL_COMMUNITY): Payer: Self-pay | Admitting: Emergency Medicine

## 2016-03-15 DIAGNOSIS — H6121 Impacted cerumen, right ear: Secondary | ICD-10-CM | POA: Insufficient documentation

## 2016-03-15 DIAGNOSIS — B349 Viral infection, unspecified: Secondary | ICD-10-CM | POA: Insufficient documentation

## 2016-03-15 DIAGNOSIS — J069 Acute upper respiratory infection, unspecified: Secondary | ICD-10-CM | POA: Insufficient documentation

## 2016-03-15 DIAGNOSIS — F17210 Nicotine dependence, cigarettes, uncomplicated: Secondary | ICD-10-CM | POA: Insufficient documentation

## 2016-03-15 DIAGNOSIS — Z79899 Other long term (current) drug therapy: Secondary | ICD-10-CM | POA: Insufficient documentation

## 2016-03-15 LAB — CBC WITH DIFFERENTIAL/PLATELET
Basophils Absolute: 0.1 10*3/uL (ref 0.0–0.1)
Basophils Relative: 1 %
EOS ABS: 0.5 10*3/uL (ref 0.0–0.7)
EOS PCT: 5 %
HCT: 41 % (ref 39.0–52.0)
Hemoglobin: 14.2 g/dL (ref 13.0–17.0)
LYMPHS ABS: 1.8 10*3/uL (ref 0.7–4.0)
LYMPHS PCT: 21 %
MCH: 30.9 pg (ref 26.0–34.0)
MCHC: 34.6 g/dL (ref 30.0–36.0)
MCV: 89.1 fL (ref 78.0–100.0)
MONO ABS: 0.6 10*3/uL (ref 0.1–1.0)
MONOS PCT: 7 %
Neutro Abs: 5.6 10*3/uL (ref 1.7–7.7)
Neutrophils Relative %: 66 %
PLATELETS: 268 10*3/uL (ref 150–400)
RBC: 4.6 MIL/uL (ref 4.22–5.81)
RDW: 13.4 % (ref 11.5–15.5)
WBC: 8.5 10*3/uL (ref 4.0–10.5)

## 2016-03-15 LAB — I-STAT CHEM 8, ED
BUN: 15 mg/dL (ref 6–20)
Calcium, Ion: 1.16 mmol/L (ref 1.15–1.40)
Chloride: 102 mmol/L (ref 101–111)
Creatinine, Ser: 0.9 mg/dL (ref 0.61–1.24)
GLUCOSE: 107 mg/dL — AB (ref 65–99)
HCT: 45 % (ref 39.0–52.0)
Hemoglobin: 15.3 g/dL (ref 13.0–17.0)
Potassium: 3.4 mmol/L — ABNORMAL LOW (ref 3.5–5.1)
SODIUM: 142 mmol/L (ref 135–145)
TCO2: 25 mmol/L (ref 0–100)

## 2016-03-15 NOTE — ED Triage Notes (Addendum)
Patient with generalized body aches, cough and coughing up yellow phlegm.  No fever at this time.  Patient states that it started yesterday.  Having some right ear pain and can not hear well.  Patient has been vomiting today, last time around 1800.

## 2016-03-16 ENCOUNTER — Emergency Department (HOSPITAL_COMMUNITY)
Admission: EM | Admit: 2016-03-16 | Discharge: 2016-03-16 | Disposition: A | Discharge status: Home / Self Care | Payer: Medicaid Other | Attending: Emergency Medicine | Admitting: Emergency Medicine

## 2016-03-16 DIAGNOSIS — B349 Viral infection, unspecified: Secondary | ICD-10-CM

## 2016-03-16 DIAGNOSIS — H6121 Impacted cerumen, right ear: Secondary | ICD-10-CM

## 2016-03-16 HISTORY — DX: Unspecified convulsions: R56.9

## 2016-03-16 NOTE — ED Provider Notes (Signed)
[facility] Provider Note   CSN: [id number] Arrival date & time: 03/15/16  2150     History   Chief Complaint Chief Complaint  Patient presents with  . Generalized Body Aches    HPI Christian Arias is a 21 y.o. male.  Patient complains of sore throat, body aches, headache x 1-2 days. He also complains of decreased, muffled hearing on the right. No ear pain. He is not aware of any fever. No diarrhea, but he reports emesis x 2 without further nausea currently. He has had a productive cough today.    The history is provided by the patient. No language interpreter was used.    Past Medical History:  Diagnosis Date  . Seizures (HCC)     There are no active problems to display for this patient.   History reviewed. No pertinent surgical history.     Home Medications    Prior to Admission medications   Medication Sig Start Date End Date Taking? Authorizing Provider  benzonatate (TESSALON) 100 MG capsule Take 1 capsule (100 mg total) by mouth 3 (three) times daily as needed for cough. 11/12/15   Kristen N Ward, DO  ondansetron (ZOFRAN ODT) 4 MG disintegrating tablet Take 1 tablet (4 mg total) by mouth every 8 (eight) hours as needed for nausea or vomiting. 11/12/15   Kristen N Ward, DO  predniSONE (DELTASONE) 20 MG tablet Take 3 tablets (60 mg total) by mouth daily. 11/12/15   Layla MawKristen N Ward, DO    Family History History reviewed. No pertinent family history.  Social History Social History  Substance Use Topics  . Smoking status: Current Every Day Smoker    Packs/day: 0.50    Types: Cigarettes  . Smokeless tobacco: Never Used  . Alcohol use No     Allergies   Ibuprofen and Tramadol   Review of Systems Review of Systems  Constitutional: Positive for appetite change. Negative for chills and fever.  HENT: Positive for sore throat.        See HPI. C/O right ear hearing change.  Respiratory: Positive for cough. Negative for shortness of breath.     Cardiovascular: Negative.  Negative for chest pain.  Gastrointestinal: Positive for vomiting. Negative for abdominal pain.  Musculoskeletal: Positive for myalgias.  Skin: Negative.  Negative for rash.  Neurological: Negative.  Negative for weakness.     Physical Exam Updated Vital Signs BP 113/69 (BP Location: Right Arm)   Pulse 83   Temp 99 F (37.2 C) (Oral)   Resp 17   SpO2 100%   Physical Exam  Constitutional: He is oriented to person, place, and time. He appears well-developed and well-nourished.  HENT:  Head: Normocephalic.  Mouth/Throat: Oropharynx is clear and moist.  Right sided cerumen impaction.  Eyes: Conjunctivae are normal.  Neck: Normal range of motion. Neck supple.  Cardiovascular: Normal rate and regular rhythm.   No murmur heard. Pulmonary/Chest: Effort normal and breath sounds normal. He has no wheezes. He has no rales.  Abdominal: Soft. Bowel sounds are normal. There is no tenderness. There is no rebound and no guarding.  Musculoskeletal: Normal range of motion.  Neurological: He is alert and oriented to person, place, and time.  Skin: Skin is warm and dry. No rash noted.  Psychiatric: He has a normal mood and affect.     ED Treatments / Results  Labs (all labs ordered are listed, but only abnormal results are displayed) Labs Reviewed  I-STAT CHEM 8, ED - Abnormal; Notable  for the following:       Result Value   Potassium 3.4 (*)    Glucose, Bld 107 (*)    All other components within normal limits  CBC WITH DIFFERENTIAL/PLATELET   Results for orders placed or performed during the hospital encounter of 03/16/16  CBC with Differential  Result Value Ref Range   WBC 8.5 4.0 - 10.5 K/uL   RBC 4.60 4.22 - 5.81 MIL/uL   Hemoglobin 14.2 13.0 - 17.0 g/dL   HCT 16.1 09.6 - 04.5 %   MCV 89.1 78.0 - 100.0 fL   MCH 30.9 26.0 - 34.0 pg   MCHC 34.6 30.0 - 36.0 g/dL   RDW 40.9 81.1 - 91.4 %   Platelets 268 150 - 400 K/uL   Neutrophils Relative % 66 %    Neutro Abs 5.6 1.7 - 7.7 K/uL   Lymphocytes Relative 21 %   Lymphs Abs 1.8 0.7 - 4.0 K/uL   Monocytes Relative 7 %   Monocytes Absolute 0.6 0.1 - 1.0 K/uL   Eosinophils Relative 5 %   Eosinophils Absolute 0.5 0.0 - 0.7 K/uL   Basophils Relative 1 %   Basophils Absolute 0.1 0.0 - 0.1 K/uL  I-Stat Chem 8, ED  Result Value Ref Range   Sodium 142 135 - 145 mmol/L   Potassium 3.4 (L) 3.5 - 5.1 mmol/L   Chloride 102 101 - 111 mmol/L   BUN 15 6 - 20 mg/dL   Creatinine, Ser 7.82 0.61 - 1.24 mg/dL   Glucose, Bld 956 (H) 65 - 99 mg/dL   Calcium, Ion 2.13 0.86 - 1.40 mmol/L   TCO2 25 0 - 100 mmol/L   Hemoglobin 15.3 13.0 - 17.0 g/dL   HCT 57.8 46.9 - 62.9 %   EKG  EKG Interpretation None       Radiology No results found.  Procedures Procedures (including critical care time)  Medications Ordered in ED Medications - No data to display   Initial Impression / Assessment and Plan / ED Course  I have reviewed the triage vital signs and the nursing notes.  Pertinent labs & imaging results that were available during my care of the patient were reviewed by me and considered in my medical decision making (see chart for details).     Patient with symptoms of viral URI - afebrile cough, sore throat, body aches. He is well appearing, non-toxic. VSS, afebrile.   Right ear lavage attempted for relief of symptoms. He can be discharged home with supportive care instructions.   Final Clinical Impressions(s) / ED Diagnoses   Final diagnoses:  None   1. Viral URI 2. Pharyngitis  New Prescriptions New Prescriptions   No medications on file     Elpidio Anis, Cordelia Poche 03/16/16 0111    Dione Booze, MD 03/16/16 [phone]

## 2016-04-14 ENCOUNTER — Ambulatory Visit (HOSPITAL_COMMUNITY)
Admission: EM | Admit: 2016-04-14 | Discharge: 2016-04-14 | Disposition: A | Discharge status: Home / Self Care | Payer: BLUE CROSS/BLUE SHIELD | Attending: Family Medicine | Admitting: Family Medicine

## 2016-04-14 ENCOUNTER — Encounter (HOSPITAL_COMMUNITY): Payer: Self-pay | Admitting: Emergency Medicine

## 2016-04-14 DIAGNOSIS — J029 Acute pharyngitis, unspecified: Secondary | ICD-10-CM | POA: Diagnosis not present

## 2016-04-14 MED ORDER — ONDANSETRON 4 MG PO TBDP
ORAL_TABLET | ORAL | Status: AC
  Filled 2016-04-14: qty 1

## 2016-04-14 MED ORDER — AZITHROMYCIN 250 MG PO TABS: ORAL_TABLET | ORAL | 0 refills | Status: DC

## 2016-04-14 MED ORDER — IPRATROPIUM BROMIDE 0.06 % NA SOLN: 2.0000 | Freq: Four times a day (QID) | NASAL | 2 refills | Status: DC

## 2016-04-14 MED ORDER — ONDANSETRON 4 MG PO TBDP
4.0000 mg | ORAL_TABLET | Freq: Once | ORAL | Status: AC
  Administered 2016-04-14: 4 mg via ORAL

## 2016-04-14 NOTE — Discharge Instructions (Signed)
Drink plenty of fluids as discussed, use medicine as prescribed, and mucinex or delsym for cough. Return or see your doctor if further problems °

## 2016-04-14 NOTE — ED Provider Notes (Signed)
[facility]    CSN: [id number] Arrival date & time: 04/14/16  1128     History   Chief Complaint Chief Complaint  Patient presents with  . Sore Throat    HPI Christian Arias is a 21 y.o. male.   The history is provided by the patient.  Sore Throat  This is a new problem. The current episode started more than 2 days ago. The problem has been gradually worsening. Pertinent negatives include no chest pain and no abdominal pain. The symptoms are aggravated by swallowing.    Past Medical History:  Diagnosis Date  . Seizures (HCC)     There are no active problems to display for this patient.   History reviewed. No pertinent surgical history.     Home Medications    Prior to Admission medications   Medication Sig Start Date End Date Taking? Authorizing Provider  azithromycin (ZITHROMAX Z-PAK) 250 MG tablet Take as directed on pack 04/14/16   Linna Hoff, MD  benzonatate (TESSALON) 100 MG capsule Take 1 capsule (100 mg total) by mouth 3 (three) times daily as needed for cough. 11/12/15   Kristen N Ward, DO  ipratropium (ATROVENT) 0.06 % nasal spray Place 2 sprays into both nostrils 4 (four) times daily. 04/14/16   Linna Hoff, MD  ondansetron (ZOFRAN ODT) 4 MG disintegrating tablet Take 1 tablet (4 mg total) by mouth every 8 (eight) hours as needed for nausea or vomiting. 11/12/15   Kristen N Ward, DO  predniSONE (DELTASONE) 20 MG tablet Take 3 tablets (60 mg total) by mouth daily. 11/12/15   Layla Maw Ward, DO    Family History History reviewed. No pertinent family history.  Social History Social History  Substance Use Topics  . Smoking status: Current Every Day Smoker    Packs/day: 0.50    Types: Cigarettes  . Smokeless tobacco: Never Used  . Alcohol use No     Allergies   Ibuprofen and Tramadol   Review of Systems Review of Systems  Constitutional: Negative.   HENT: Positive for congestion, postnasal drip, rhinorrhea and sore throat.     Respiratory: Negative.   Cardiovascular: Negative.  Negative for chest pain.  Gastrointestinal: Positive for vomiting. Negative for abdominal pain.  Genitourinary: Negative.   All other systems reviewed and are negative.    Physical Exam Triage Vital Signs ED Triage Vitals  Enc Vitals Group     BP 04/14/16 1148 102/56     Pulse Rate 04/14/16 1148 79     Resp 04/14/16 1148 18     Temp 04/14/16 1148 97.9 F (36.6 C)     Temp Source 04/14/16 1148 Oral     SpO2 04/14/16 1148 100 %     Weight --      Height --      Head Circumference --      Peak Flow --      Pain Score 04/14/16 1150 9     Pain Loc --      Pain Edu? --      Excl. in GC? --    No data found.   Updated Vital Signs BP 102/56 (BP Location: Left Arm)   Pulse 79   Temp 97.9 F (36.6 C) (Oral)   Resp 18   SpO2 100%   Visual Acuity Right Eye Distance:   Left Eye Distance:   Bilateral Distance:    Right Eye Near:   Left Eye Near:    Bilateral Near:  Physical Exam  Constitutional: He appears well-developed and well-nourished.  HENT:  Right Ear: External ear normal.  Left Ear: External ear normal.  Nose: Nose normal.  Neck: Normal range of motion. Neck supple.  Cardiovascular: Normal rate and regular rhythm.   Pulmonary/Chest: Effort normal and breath sounds normal.  Lymphadenopathy:    He has no cervical adenopathy.  Skin: Skin is warm and dry.  Nursing note and vitals reviewed.    UC Treatments / Results  Labs (all labs ordered are listed, but only abnormal results are displayed) Labs Reviewed - No data to display  EKG  EKG Interpretation None       Radiology No results found.  Procedures Procedures (including critical care time)  Medications Ordered in UC Medications  ondansetron (ZOFRAN-ODT) disintegrating tablet 4 mg (4 mg Oral Given 04/14/16 1218)     Initial Impression / Assessment and Plan / UC Course  I have reviewed the triage vital signs and the nursing  notes.  Pertinent labs & imaging results that were available during my care of the patient were reviewed by me and considered in my medical decision making (see chart for details).       Final Clinical Impressions(s) / UC Diagnoses   Final diagnoses:  Acute pharyngitis, unspecified etiology    New Prescriptions New Prescriptions   AZITHROMYCIN (ZITHROMAX Z-PAK) 250 MG TABLET    Take as directed on pack   IPRATROPIUM (ATROVENT) 0.06 % NASAL SPRAY    Place 2 sprays into both nostrils 4 (four) times daily.     Linna HoffJames D Sophiarose Eades, MD 04/14/16 [phone]

## 2016-04-14 NOTE — ED Triage Notes (Signed)
Here for ST onset 4 days associated w/dysphagia, vomiting up blood, cough   Denies fevers   A&O x4... NAD

## 2016-10-30 ENCOUNTER — Emergency Department (HOSPITAL_COMMUNITY): Payer: BLUE CROSS/BLUE SHIELD

## 2016-10-30 ENCOUNTER — Encounter (HOSPITAL_COMMUNITY): Payer: Self-pay | Admitting: Emergency Medicine

## 2016-10-30 ENCOUNTER — Emergency Department (HOSPITAL_COMMUNITY)
Admission: EM | Admit: 2016-10-30 | Discharge: 2016-10-30 | Disposition: A | Discharge status: Home / Self Care | Payer: BLUE CROSS/BLUE SHIELD | Attending: Emergency Medicine | Admitting: Emergency Medicine

## 2016-10-30 DIAGNOSIS — B9789 Other viral agents as the cause of diseases classified elsewhere: Secondary | ICD-10-CM | POA: Diagnosis not present

## 2016-10-30 DIAGNOSIS — R05 Cough: Secondary | ICD-10-CM | POA: Diagnosis present

## 2016-10-30 DIAGNOSIS — R112 Nausea with vomiting, unspecified: Secondary | ICD-10-CM | POA: Diagnosis not present

## 2016-10-30 DIAGNOSIS — J069 Acute upper respiratory infection, unspecified: Secondary | ICD-10-CM | POA: Diagnosis not present

## 2016-10-30 DIAGNOSIS — Z87891 Personal history of nicotine dependence: Secondary | ICD-10-CM | POA: Insufficient documentation

## 2016-10-30 DIAGNOSIS — J40 Bronchitis, not specified as acute or chronic: Secondary | ICD-10-CM | POA: Insufficient documentation

## 2016-10-30 LAB — COMPREHENSIVE METABOLIC PANEL
ALT: 25 U/L (ref 17–63)
AST: 29 U/L (ref 15–41)
Albumin: 4.5 g/dL (ref 3.5–5.0)
Alkaline Phosphatase: 50 U/L (ref 38–126)
Anion gap: 10 (ref 5–15)
BUN: 14 mg/dL (ref 6–20)
CALCIUM: 9.6 mg/dL (ref 8.9–10.3)
CO2: 25 mmol/L (ref 22–32)
CREATININE: 1.18 mg/dL (ref 0.61–1.24)
Chloride: 105 mmol/L (ref 101–111)
GFR calc non Af Amer: >60 mL/min (ref >60)
Glucose, Bld: 101 mg/dL — ABNORMAL HIGH (ref 65–99)
Potassium: 3.4 mmol/L — ABNORMAL LOW (ref 3.5–5.1)
SODIUM: 140 mmol/L (ref 135–145)
Total Bilirubin: 0.6 mg/dL (ref 0.3–1.2)
Total Protein: 7.4 g/dL (ref 6.5–8.1)

## 2016-10-30 LAB — CBC
HEMATOCRIT: 38.6 % — AB (ref 39.0–52.0)
HEMOGLOBIN: 13.7 g/dL (ref 13.0–17.0)
MCH: 31.1 pg (ref 26.0–34.0)
MCHC: 35.5 g/dL (ref 30.0–36.0)
MCV: 87.7 fL (ref 78.0–100.0)
Platelets: 217 10*3/uL (ref 150–400)
RBC: 4.4 MIL/uL (ref 4.22–5.81)
RDW: 14.5 % (ref 11.5–15.5)
WBC: 8.9 10*3/uL (ref 4.0–10.5)

## 2016-10-30 LAB — LIPASE, BLOOD: Lipase: 21 U/L (ref 11–51)

## 2016-10-30 MED ORDER — ALBUTEROL SULFATE (2.5 MG/3ML) 0.083% IN NEBU
5.0000 mg | INHALATION_SOLUTION | Freq: Once | RESPIRATORY_TRACT | Status: AC
  Administered 2016-10-30: 5 mg via RESPIRATORY_TRACT
  Filled 2016-10-30: qty 6

## 2016-10-30 MED ORDER — ALBUTEROL SULFATE HFA 108 (90 BASE) MCG/ACT IN AERS: 2.0000 | INHALATION_SPRAY | RESPIRATORY_TRACT | 0 refills | Status: DC | PRN

## 2016-10-30 MED ORDER — PREDNISONE 20 MG PO TABS
60.0000 mg | ORAL_TABLET | Freq: Once | ORAL | Status: AC
  Administered 2016-10-30: 60 mg via ORAL
  Filled 2016-10-30: qty 3

## 2016-10-30 MED ORDER — PREDNISONE 20 MG PO TABS: 40.0000 mg | ORAL_TABLET | Freq: Every day | ORAL | 0 refills | Status: DC

## 2016-10-30 MED ORDER — FLUTICASONE PROPIONATE 50 MCG/ACT NA SUSP: 2.0000 | Freq: Every day | NASAL | 0 refills | Status: DC

## 2016-10-30 MED ORDER — ONDANSETRON 4 MG PO TBDP
4.0000 mg | ORAL_TABLET | Freq: Once | ORAL | Status: AC
  Administered 2016-10-30: 4 mg via ORAL
  Filled 2016-10-30: qty 1

## 2016-10-30 MED ORDER — ONDANSETRON HCL 4 MG/2ML IJ SOLN: 4.0000 mg | Freq: Once | INTRAMUSCULAR | Status: DC

## 2016-10-30 MED ORDER — IPRATROPIUM BROMIDE HFA 17 MCG/ACT IN AERS
4.0000 | INHALATION_SPRAY | Freq: Once | RESPIRATORY_TRACT | Status: AC
  Administered 2016-10-30: 4 via RESPIRATORY_TRACT
  Filled 2016-10-30: qty 12.9

## 2016-10-30 MED ORDER — NAPROXEN 500 MG PO TABS: 500.0000 mg | ORAL_TABLET | Freq: Two times a day (BID) | ORAL | 0 refills | Status: DC

## 2016-10-30 NOTE — ED Provider Notes (Signed)
[facility] Provider Note   CSN: [id number] Arrival date & time: 10/30/16  0259     History   Chief Complaint Chief Complaint  Patient presents with  . Cough  . Emesis  . Night Sweats    HPI Christian Arias is a 21 y.o. male.  HPI  This is a 21 year old male who presents with cough, vomiting, rhinorrhea, diaphoresis. Patient reports 2 day history of worsening cough. Cough is nonproductive. Patient reports coughing fits. He reports nasal congestion and shortness of breath. No known sick contacts. No fever but does report night sweats. He reports taking over-the-counter medication with minimal relief. He is a former smoker but continues to vape.  Past Medical History:  Diagnosis Date  . Seizures (HCC)     There are no active problems to display for this patient.   History reviewed. No pertinent surgical history.     Home Medications    Prior to Admission medications   Medication Sig Start Date End Date Taking? Authorizing Provider  acetaminophen (TYLENOL) 500 MG tablet Take 1,000 mg by mouth every 6 (six) hours as needed for mild pain, moderate pain or headache.   Yes [provider]  albuterol (PROVENTIL HFA;VENTOLIN HFA) 108 (90 Base) MCG/ACT inhaler Inhale 2 puffs into the lungs every 4 (four) hours as needed for wheezing or shortness of breath. 10/30/16   Alvaretta Eisenberger, Mayer Masker, MD  fluticasone (FLONASE) 50 MCG/ACT nasal spray Place 2 sprays into both nostrils daily. 10/30/16   Indyah Saulnier, Mayer Masker, MD  naproxen (NAPROSYN) 500 MG tablet Take 1 tablet (500 mg total) by mouth 2 (two) times daily. 10/30/16   Sebastien Jackson, Mayer Masker, MD  predniSONE (DELTASONE) 20 MG tablet Take 2 tablets (40 mg total) by mouth daily. 10/30/16   Davina Howlett, Mayer Masker, MD    Family History History reviewed. No pertinent family history.  Social History Social History  Substance Use Topics  . Smoking status: Former Smoker    Packs/day: 0.50    Types: Cigarettes  . Smokeless tobacco:  Never Used  . Alcohol use No     Allergies   Ibuprofen and Tramadol   Review of Systems Review of Systems  Constitutional: Positive for chills and diaphoresis. Negative for fever.  HENT: Positive for rhinorrhea. Negative for sore throat.   Respiratory: Positive for cough, shortness of breath and wheezing.   Cardiovascular: Negative for chest pain.  Gastrointestinal: Positive for nausea and vomiting. Negative for abdominal pain and diarrhea.  Skin: Negative for rash.  All other systems reviewed and are negative.    Physical Exam Updated Vital Signs BP 127/67 (BP Location: Left Arm)   Pulse 87   Temp 97.6 F (36.4 C) (Oral)   Resp 20   Ht  (1.727 m)   Wt 62.6 kg (138 lb)   SpO2 95%   BMI 20.98 kg/m   Physical Exam  Constitutional: He is oriented to person, place, and time. He appears well-developed and well-nourished. No distress.  HENT:  Head: Normocephalic and atraumatic.  Mouth/Throat: Oropharynx is clear and moist. No oropharyngeal exudate.  Eyes: Right eye exhibits no discharge.  Neck: Normal range of motion. Neck supple.  Cardiovascular: Normal rate, regular rhythm and normal heart sounds.   No murmur heard. Pulmonary/Chest: Effort normal. No respiratory distress. He has wheezes.  Diffuse wheezing in all lung fields  Abdominal: Soft. Bowel sounds are normal. There is no tenderness. There is no rebound.  Lymphadenopathy:    He has no cervical adenopathy.  Neurological: He is alert and oriented to person, place, and time.  Skin: Skin is warm and dry.  Psychiatric: He has a normal mood and affect.  Nursing note and vitals reviewed.    ED Treatments / Results  Labs (all labs ordered are listed, but only abnormal results are displayed) Labs Reviewed  COMPREHENSIVE METABOLIC PANEL - Abnormal; Notable for the following:       Result Value   Potassium 3.4 (*)    Glucose, Bld 101 (*)    All other components within normal limits  CBC - Abnormal; Notable  for the following:    HCT 38.6 (*)    All other components within normal limits  LIPASE, BLOOD  URINALYSIS, ROUTINE W REFLEX MICROSCOPIC    EKG  EKG Interpretation None       Radiology Dg Chest 2 View  Result Date: 10/30/2016 CLINICAL DATA:  Cough, congestion, and fever for 24 hours. Shortness of breath. EXAM: CHEST  2 VIEW COMPARISON:  12/05/2015 FINDINGS: The heart size and mediastinal contours are within normal limits. Both lungs are clear. The visualized skeletal structures are unremarkable. IMPRESSION: No active cardiopulmonary disease. Electronically Signed   By: Burman NievesWilliam  Stryker M.D.   On: 10/30/2016 03:41    Procedures Procedures (including critical care time)  Medications Ordered in ED Medications  albuterol (PROVENTIL) (2.5 MG/3ML) 0.083% nebulizer solution 5 mg (5 mg Nebulization Given 10/30/16 0330)  ondansetron (ZOFRAN-ODT) disintegrating tablet 4 mg (4 mg Oral Given 10/30/16 0335)  predniSONE (DELTASONE) tablet 60 mg (60 mg Oral Given 10/30/16 0549)  ipratropium (ATROVENT HFA) inhaler 4 puff (4 puffs Inhalation Given 10/30/16 0550)     Initial Impression / Assessment and Plan / ED Course  I have reviewed the triage vital signs and the nursing notes.  Pertinent labs & imaging results that were available during my care of the patient were reviewed by me and considered in my medical decision making (see chart for details).     Patient presents with upper respiratory symptoms. He is nontoxic on exam. Vital signs reviewed and reassuring. Afebrile. He is diffusely wheezy in all lung fields.  Chest x-ray is negative for pneumonia. Lab work is largely reassuring. He is in no acute distress. He was given prednisone and an inhaler. Given his recent former smoking, suspect acute bronchitis and bronchospasm related to viral URI. Patient was given an inhaler. On recheck, he has better aeration. Continues to have some wheezing with coughing. Patient given prednisone. Will treat  symptomatically for likely viral etiology.  After history, exam, and medical workup I feel the patient has been appropriately medically screened and is safe for discharge home. Pertinent diagnoses were discussed with the patient. Patient was given return precautions.   Final Clinical Impressions(s) / ED Diagnoses   Final diagnoses:  Bronchitis  Viral URI with cough    New Prescriptions New Prescriptions   ALBUTEROL (PROVENTIL HFA;VENTOLIN HFA) 108 (90 BASE) MCG/ACT INHALER    Inhale 2 puffs into the lungs every 4 (four) hours as needed for wheezing or shortness of breath.   FLUTICASONE (FLONASE) 50 MCG/ACT NASAL SPRAY    Place 2 sprays into both nostrils daily.   NAPROXEN (NAPROSYN) 500 MG TABLET    Take 1 tablet (500 mg total) by mouth 2 (two) times daily.   PREDNISONE (DELTASONE) 20 MG TABLET    Take 2 tablets (40 mg total) by mouth daily.     Shon BatonHorton, Jeidy Hoerner F, MD 10/30/16 [phone]

## 2016-10-30 NOTE — ED Notes (Signed)
Pt was given grape juice for po challenge.

## 2016-10-30 NOTE — ED Notes (Signed)
Respiratory therapy at bedside to administer breathing treatment.

## 2016-10-30 NOTE — Discharge Instructions (Signed)
You were seen today for upper respiratory symptoms. You likely have bronchitis. Use inhaler and take prednisone for inflammation. Ibuprofen as needed for fevers or pain. Flonase for congestion. If you develop worsening shortness of breath or any worsening symptoms you should be reevaluated.

## 2016-10-30 NOTE — ED Triage Notes (Signed)
Patient complaining of coughing, emesis, and night sweats that started Saturday. Patient has taken tylenol around 10/29/2016 9pm. Patient is having a non productive cough. No signs of sweating.

## 2017-04-05 ENCOUNTER — Emergency Department (HOSPITAL_COMMUNITY)
Admission: EM | Admit: 2017-04-05 | Discharge: 2017-04-05 | Disposition: A | Discharge status: Home / Self Care | Payer: BLUE CROSS/BLUE SHIELD | Attending: Emergency Medicine | Admitting: Emergency Medicine

## 2017-04-05 ENCOUNTER — Other Ambulatory Visit: Payer: Self-pay

## 2017-04-05 ENCOUNTER — Encounter (HOSPITAL_COMMUNITY): Payer: Self-pay

## 2017-04-05 DIAGNOSIS — J029 Acute pharyngitis, unspecified: Secondary | ICD-10-CM | POA: Insufficient documentation

## 2017-04-05 DIAGNOSIS — Z87891 Personal history of nicotine dependence: Secondary | ICD-10-CM | POA: Insufficient documentation

## 2017-04-05 DIAGNOSIS — R590 Localized enlarged lymph nodes: Secondary | ICD-10-CM | POA: Insufficient documentation

## 2017-04-05 DIAGNOSIS — B9789 Other viral agents as the cause of diseases classified elsewhere: Secondary | ICD-10-CM | POA: Diagnosis not present

## 2017-04-05 LAB — RAPID STREP SCREEN (MED CTR MEBANE ONLY): STREPTOCOCCUS, GROUP A SCREEN (DIRECT): NEGATIVE

## 2017-04-05 NOTE — Discharge Instructions (Signed)
Please read instructions below. You can take tylenol or ibuprofen as needed for sore throat. Drink plenty of water. Follow up with your primary care provider as needed. Return to the ER for difficulty swallowing liquids, difficulty breathing, or new or worsening symptoms.

## 2017-04-05 NOTE — ED Triage Notes (Signed)
Per EMS-throat pain that started yesterday, states OTC meds have not helped

## 2017-04-05 NOTE — ED Provider Notes (Signed)
[facility]-EMERGENCY DEPT Provider Note   CSN: [id number] Arrival date & time: 04/05/17  1448     History   Chief Complaint Chief Complaint  Patient presents with  . Sore Throat    HPI Christian Arias is a 22 y.o. male presenting to ED via EMS with acute onset of sore throat that began upon awakening this morning. Pt reports associated dysphagia.  Denies difficulty swallowing or breathing.  Denies congestion, rhinorrhea, fever, ear pain, cough, or other complaints.  Has not tried any medications for his symptoms.  The history is provided by the patient.    Past Medical History:  Diagnosis Date  . Seizures (HCC)     There are no active problems to display for this patient.   History reviewed. No pertinent surgical history.     Home Medications    Prior to Admission medications   Medication Sig Start Date End Date Taking? Authorizing Provider  acetaminophen (TYLENOL) 500 MG tablet Take 1,000 mg by mouth every 6 (six) hours as needed for mild pain, moderate pain or headache.    [provider]  albuterol (PROVENTIL HFA;VENTOLIN HFA) 108 (90 Base) MCG/ACT inhaler Inhale 2 puffs into the lungs every 4 (four) hours as needed for wheezing or shortness of breath. 10/30/16   Horton, Mayer Masker, MD  fluticasone (FLONASE) 50 MCG/ACT nasal spray Place 2 sprays into both nostrils daily. 10/30/16   Horton, Mayer Masker, MD  naproxen (NAPROSYN) 500 MG tablet Take 1 tablet (500 mg total) by mouth 2 (two) times daily. 10/30/16   Horton, Mayer Masker, MD  predniSONE (DELTASONE) 20 MG tablet Take 2 tablets (40 mg total) by mouth daily. 10/30/16   Horton, Mayer Masker, MD    Family History History reviewed. No pertinent family history.  Social History Social History   Tobacco Use  . Smoking status: Former Smoker    Packs/day: 0.50    Types: Cigarettes  . Smokeless tobacco: Never Used  Substance Use Topics  . Alcohol use: No  . Drug use: Yes    Types:  Marijuana     Allergies   Ibuprofen and Tramadol   Review of Systems Review of Systems  Constitutional: Negative for fever.  HENT: Positive for sore throat. Negative for congestion, ear pain, rhinorrhea, trouble swallowing and voice change.   Respiratory: Negative for cough.   All other systems reviewed and are negative.    Physical Exam Updated Vital Signs BP 109/68 (BP Location: Left Arm)   Pulse 85   Temp 98.8 F (37.1 C) (Oral)   Resp 20   Ht 5\' 8"  (1.727 m)   Wt 63.5 kg (140 lb)   SpO2 100%   BMI 21.29 kg/m   Physical Exam  Constitutional: He appears well-developed and well-nourished. He does not appear ill. No distress.  Well-appearing, tolerating secretions.  Not in distress.  HENT:  Head: Normocephalic and atraumatic.  Right Ear: Tympanic membrane, external ear and ear canal normal.  Left Ear: Tympanic membrane, external ear and ear canal normal.  Nose: Nose normal.  Mouth/Throat: Uvula is midline. No trismus in the jaw. No uvula swelling. Posterior oropharyngeal erythema (Mild) present. No posterior oropharyngeal edema. No tonsillar exudate.  Eyes: Conjunctivae are normal.  Neck: Normal range of motion. Neck supple.  Cardiovascular: Normal rate, regular rhythm, normal heart sounds and intact distal pulses.  Pulmonary/Chest: Effort normal and breath sounds normal. No respiratory distress.  Lymphadenopathy:    He has cervical adenopathy (mild).  Psychiatric: He  has a normal mood and affect. His behavior is normal.  Nursing note and vitals reviewed.    ED Treatments / Results  Labs (all labs ordered are listed, but only abnormal results are displayed) Labs Reviewed  RAPID STREP SCREEN (NOT AT [facility])  CULTURE, GROUP A STREP [facility])    EKG  EKG Interpretation None       Radiology No results found.  Procedures Procedures (including critical care time)  Medications Ordered in ED Medications - No data to display   Initial Impression /  Assessment and Plan / ED Course  I have reviewed the triage vital signs and the nursing notes.  Pertinent labs & imaging results that were available during my care of the patient were reviewed by me and considered in my medical decision making (see chart for details).     Pt afebrile without tonsillar exudate, negative strep. Presents with mild cervical lymphadenopathy, & dysphagia; diagnosis of viral pharyngitis. No abx indicated. DC w symptomatic tx for pain  Pt does not appear dehydrated, but did discuss importance of water rehydration. Presentation non concerning for PTA or infxn spread to soft tissue. No trismus or uvula deviation. Specific return precautions discussed. Pt able to drink water in ED without difficulty with intact air way. Recommended PCP follow up.  Discussed results, findings, treatment and follow up. Patient advised of return precautions. Patient verbalized understanding and agreed with plan.  Final Clinical Impressions(s) / ED Diagnoses   Final diagnoses:  Viral pharyngitis    ED Discharge Orders    None       Robinson, [place]il[place] N, PA-C 04/05/17 2022    Terrilee FilesButler, Michael C, MD 04/07/17 1244

## 2017-04-07 LAB — CULTURE, GROUP A STREP (THRC)

## 2017-10-13 IMAGING — DX DG CHEST 2V
2 series · 2 of 2 positions shown · non-contrast
Comparison: Chest 02/26/2013

CLINICAL DATA: Chest tightness and shortness of breath for 3 days.

EXAM:
CHEST  2 VIEW

[chest pa]
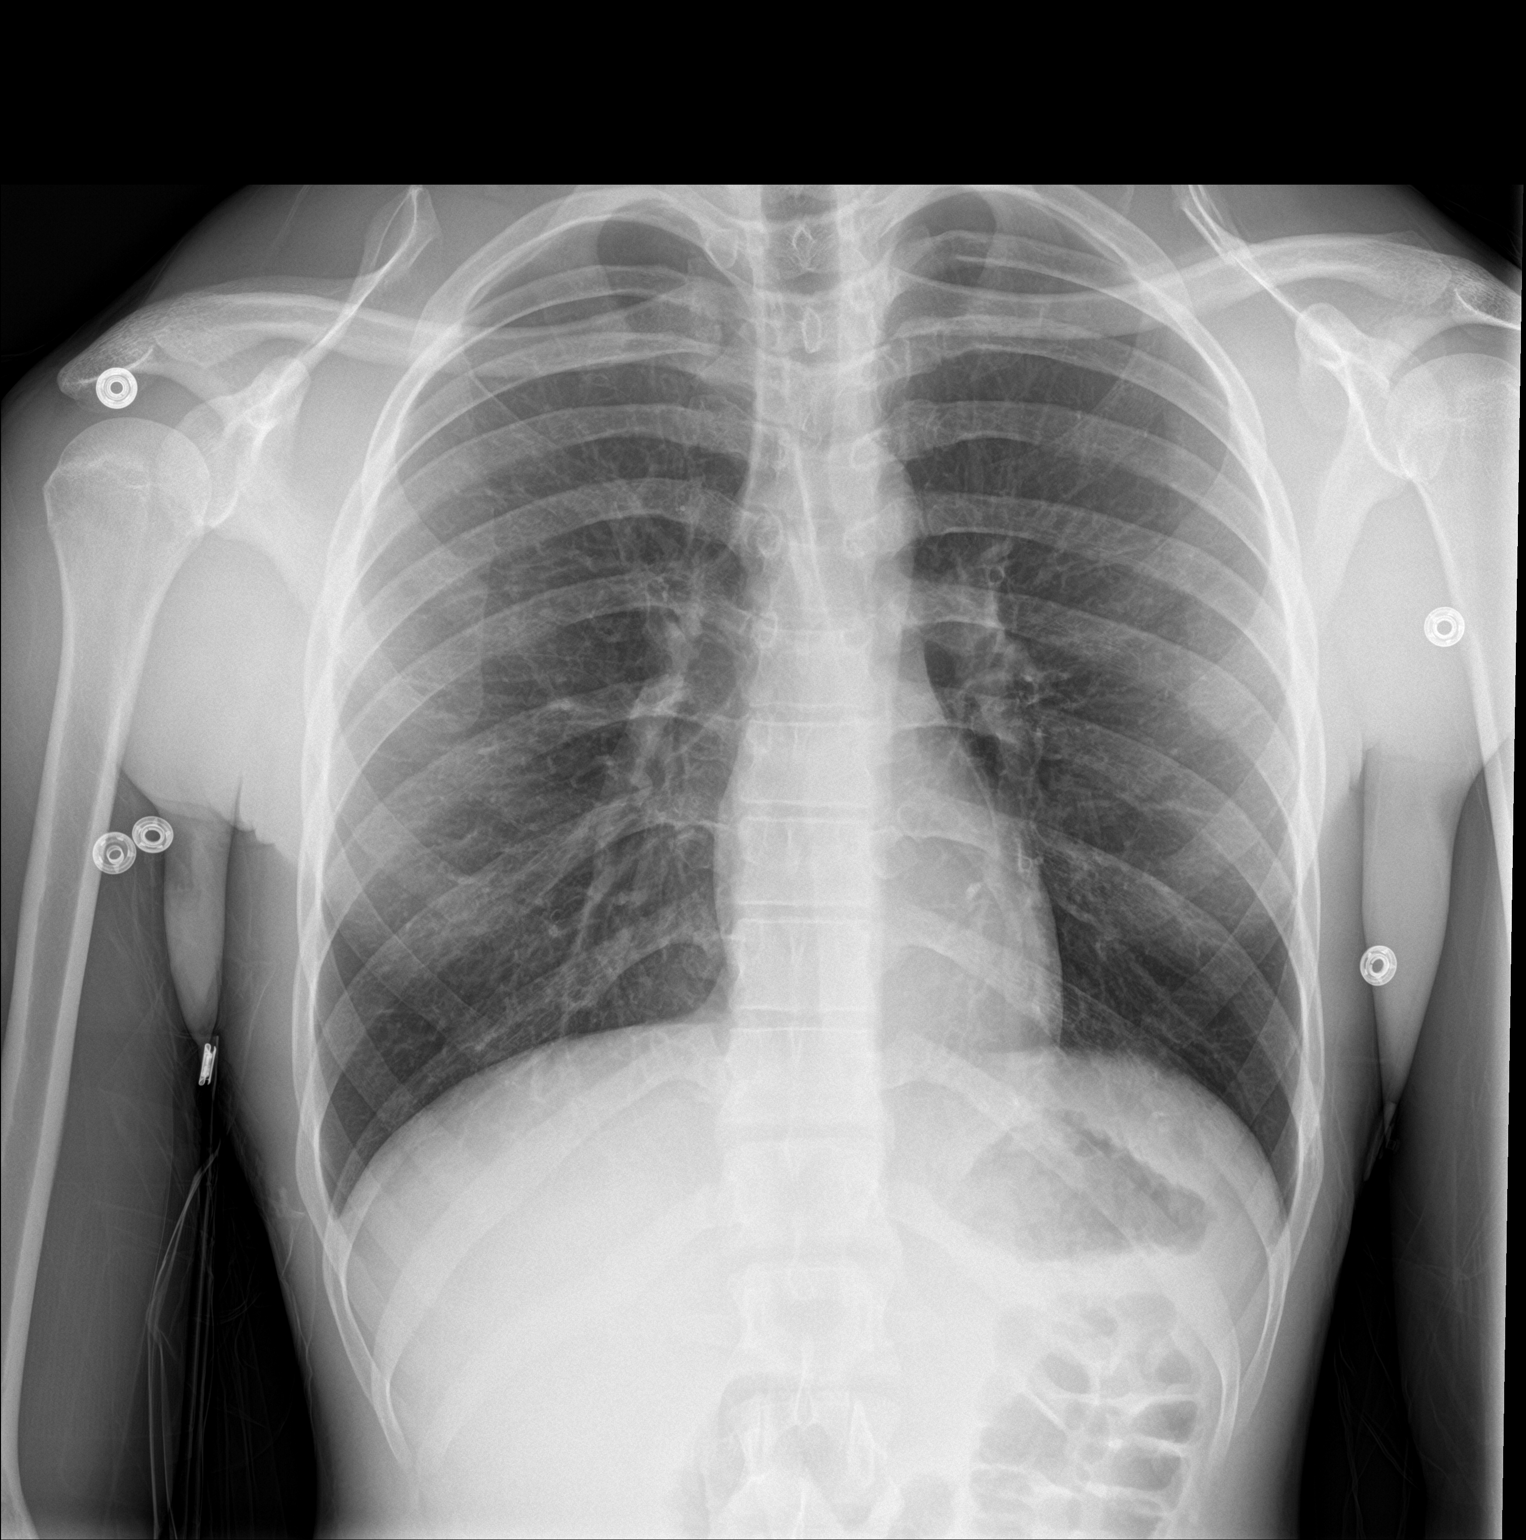

[chest lat]
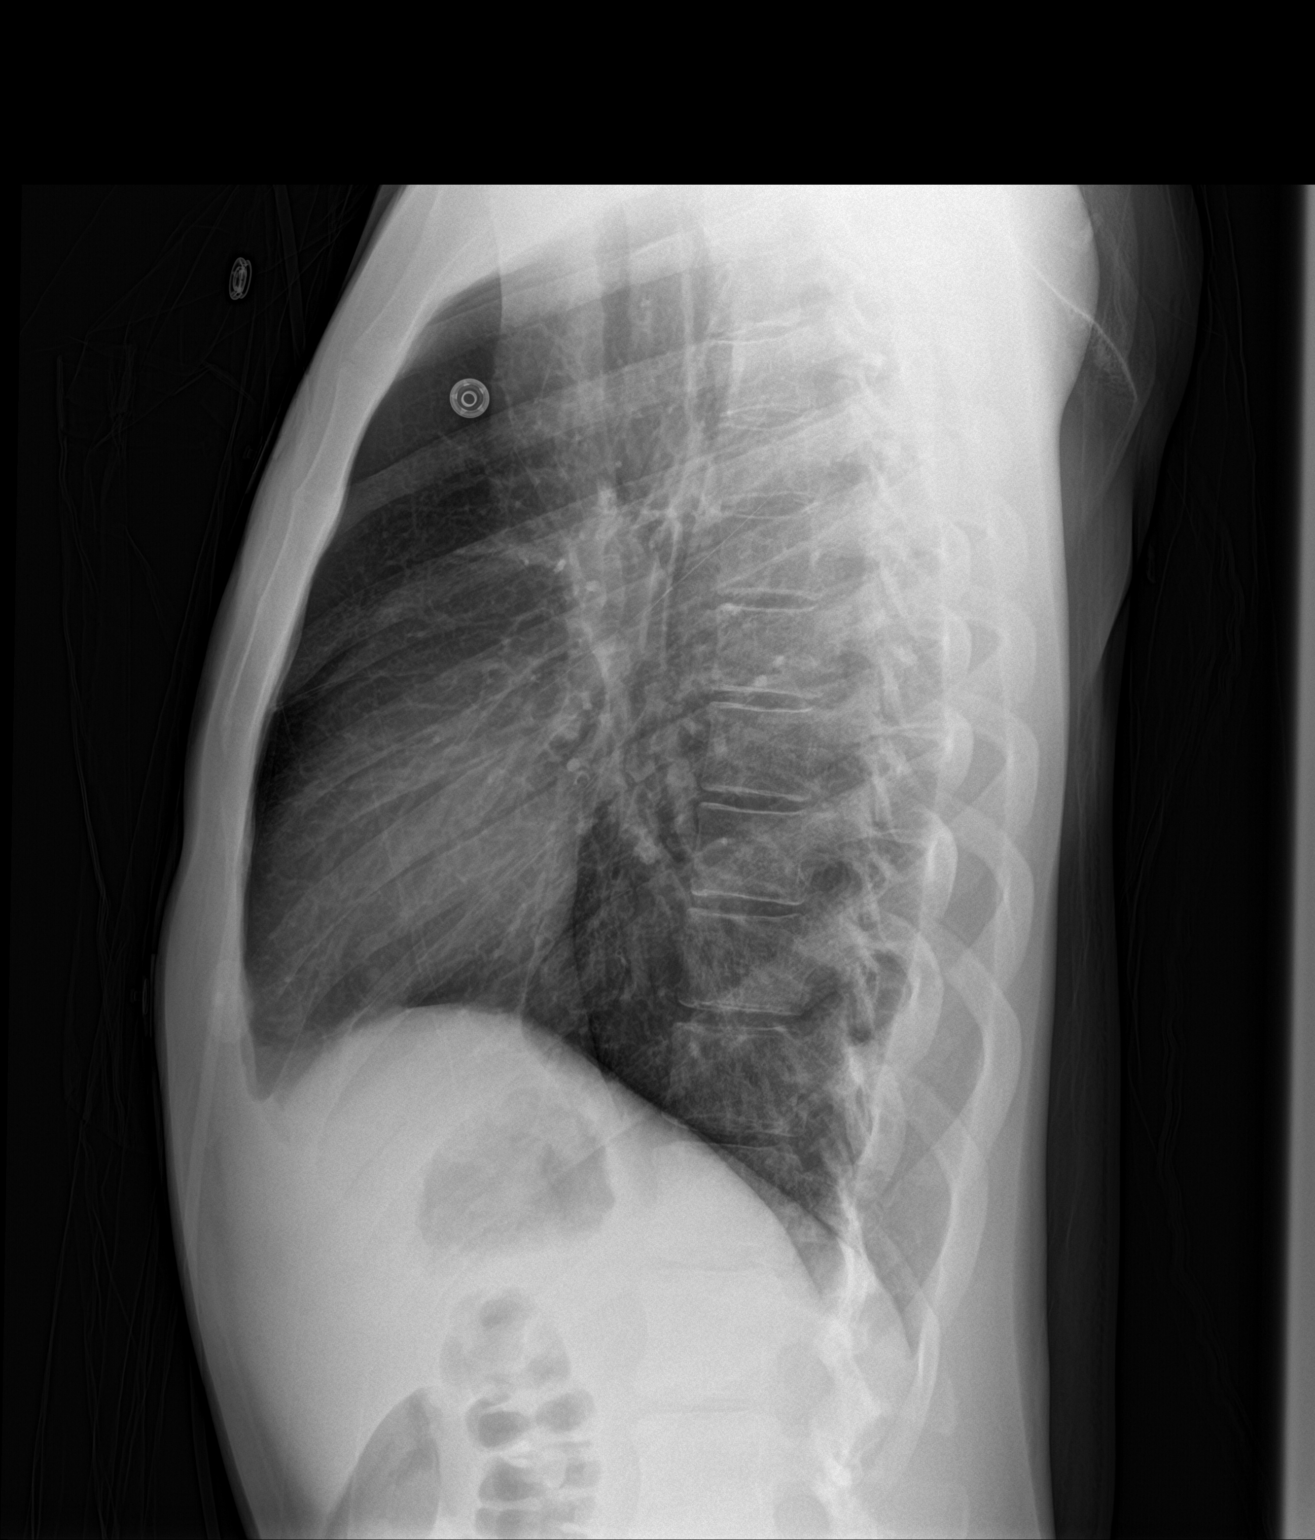

[2 of 2 positions shown; findings below may reference images not displayed]

FINDINGS: The heart size and mediastinal contours are within normal limits.
Both lungs are clear. The visualized skeletal structures are
unremarkable.
IMPRESSION: No active cardiopulmonary disease.

## 2017-11-05 IMAGING — DX DG CHEST 2V
2 series · 2 of 2 positions shown · non-contrast
Comparison: Chest radiograph from 11/12/2015

CLINICAL DATA: Subacute onset of intermittent central chest pain
and mild shortness of breath. Headache. Initial encounter.

EXAM:
CHEST  2 VIEW

[chest pa]
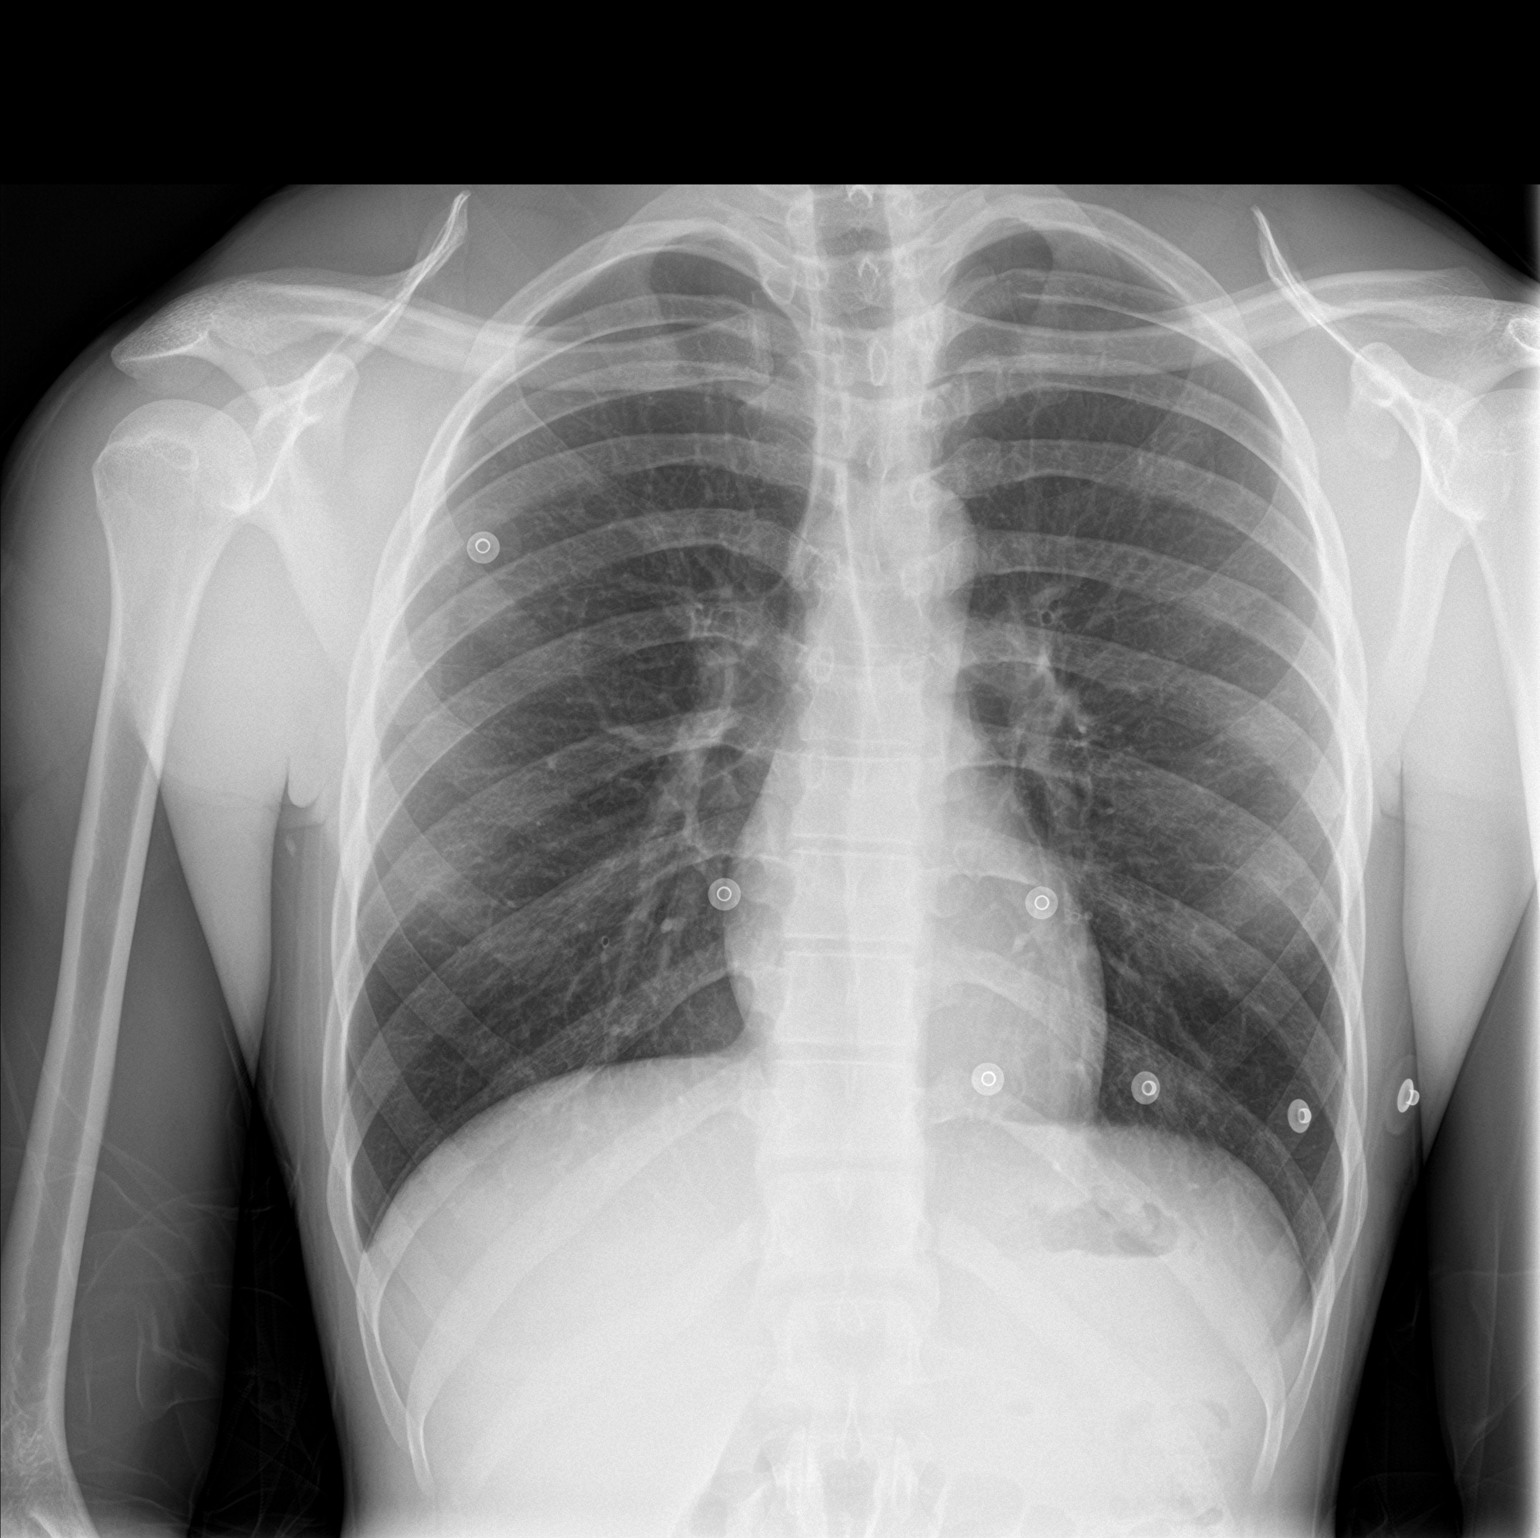

[chest lat]
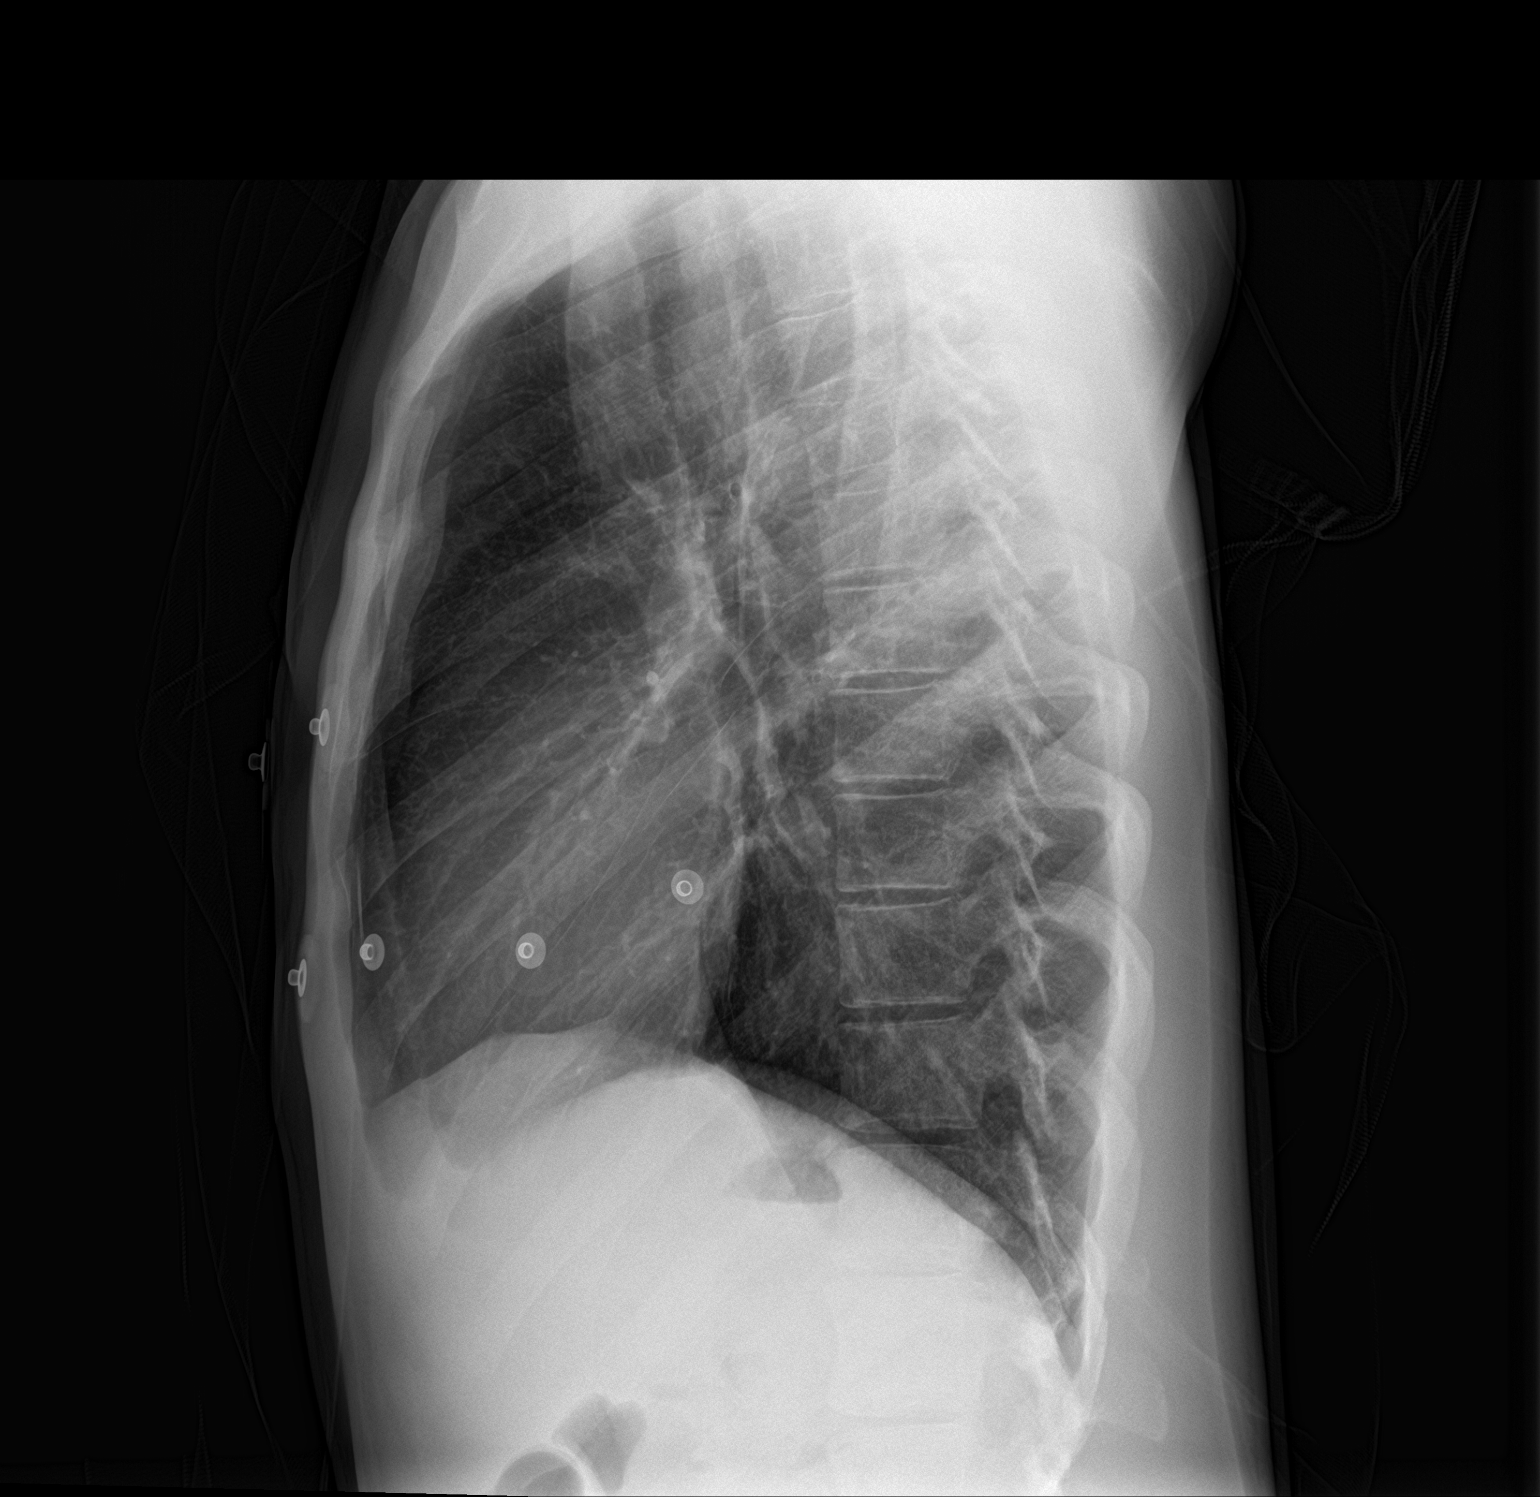

[2 of 2 positions shown; findings below may reference images not displayed]

FINDINGS: The lungs are well-aerated and clear. There is no evidence of focal
opacification, pleural effusion or pneumothorax.

The heart is normal in size; the mediastinal contour is within
normal limits. No acute osseous abnormalities are seen.
IMPRESSION: No acute cardiopulmonary process seen.

## 2017-12-25 ENCOUNTER — Emergency Department (HOSPITAL_COMMUNITY)
Admission: EM | Admit: 2017-12-25 | Discharge: 2017-12-25 | Disposition: A | Discharge status: Home / Self Care | Payer: BLUE CROSS/BLUE SHIELD | Attending: Emergency Medicine | Admitting: Emergency Medicine

## 2017-12-25 ENCOUNTER — Encounter (HOSPITAL_COMMUNITY): Payer: Self-pay

## 2017-12-25 DIAGNOSIS — M545 Low back pain, unspecified: Secondary | ICD-10-CM

## 2017-12-25 DIAGNOSIS — Z87891 Personal history of nicotine dependence: Secondary | ICD-10-CM | POA: Insufficient documentation

## 2017-12-25 DIAGNOSIS — Z79899 Other long term (current) drug therapy: Secondary | ICD-10-CM | POA: Insufficient documentation

## 2017-12-25 LAB — I-STAT CHEM 8, ED
BUN: 16 mg/dL (ref 6–20)
CALCIUM ION: 1.17 mmol/L (ref 1.15–1.40)
Chloride: 104 mmol/L (ref 98–111)
Creatinine, Ser: 1 mg/dL (ref 0.61–1.24)
Glucose, Bld: 94 mg/dL (ref 70–99)
HEMATOCRIT: 45 % (ref 39.0–52.0)
Hemoglobin: 15.3 g/dL (ref 13.0–17.0)
POTASSIUM: 3.9 mmol/L (ref 3.5–5.1)
SODIUM: 140 mmol/L (ref 135–145)
TCO2: 27 mmol/L (ref 22–32)

## 2017-12-25 LAB — I-STAT TROPONIN, ED: Troponin i, poc: 0 ng/mL (ref 0.00–0.08)

## 2017-12-25 MED ORDER — HYDROCODONE-ACETAMINOPHEN 5-325 MG PO TABS: 1.0000 | ORAL_TABLET | Freq: Four times a day (QID) | ORAL | 0 refills | Status: DC | PRN

## 2017-12-25 MED ORDER — LIDOCAINE 5 % EX PTCH: 1.0000 | MEDICATED_PATCH | CUTANEOUS | 0 refills | Status: DC

## 2017-12-25 MED ORDER — METHOCARBAMOL 500 MG PO TABS: 500.0000 mg | ORAL_TABLET | Freq: Two times a day (BID) | ORAL | 0 refills | Status: DC

## 2017-12-25 MED ORDER — METHOCARBAMOL 500 MG PO TABS
1000.0000 mg | ORAL_TABLET | Freq: Once | ORAL | Status: AC
  Administered 2017-12-25: 1000 mg via ORAL
  Filled 2017-12-25: qty 2

## 2017-12-25 MED ORDER — IBUPROFEN 600 MG PO TABS: 600.0000 mg | ORAL_TABLET | Freq: Four times a day (QID) | ORAL | 0 refills | Status: DC | PRN

## 2017-12-25 MED ORDER — IBUPROFEN 400 MG PO TABS
600.0000 mg | ORAL_TABLET | Freq: Once | ORAL | Status: AC
  Administered 2017-12-25: 600 mg via ORAL
  Filled 2017-12-25: qty 1

## 2017-12-25 NOTE — ED Provider Notes (Signed)
[facility] EMERGENCY DEPARTMENT Provider Note   CSN: [id number] Arrival date & time: 12/25/17  0603     History   Chief Complaint Chief Complaint  Patient presents with  . Back Pain    HPI Christian Arias is a 22 y.o. male.  HPI   Christian Arias is a 22 y.o. male, with a history of seizures, presenting to the ED with back pain beginning yesterday. He twisted while changing a tire and felt tightness in the lower back. He did not have much discomfort initially, but woke up with his current pain.  Pain is bilateral, but worse on the left, described as a tightness, sometimes sharp, rates it 8/10, nonradiating.  Patient also complains of intermittent chest pain.  Last occurrence was last week.  It is central to lower chest, usually sharp, lasting for a few seconds to a couple minutes, nonradiating.  It will occur at seemingly random times, but usually at rest.  He notes no accompanying symptoms with this.  This has been going on for several years and has not changed in intensity or frequency.  Denies numbness, weakness, changes in bowel or bladder function, saddle anesthesias, abdominal pain, or any other complaints.     Past Medical History:  Diagnosis Date  . Seizures (HCC)     There are no active problems to display for this patient.   History reviewed. No pertinent surgical history.      Home Medications    Prior to Admission medications   Medication Sig Start Date End Date Taking? Authorizing Provider  acetaminophen (TYLENOL) 500 MG tablet Take 1,000 mg by mouth every 6 (six) hours as needed for mild pain, moderate pain or headache.    [provider]  albuterol (PROVENTIL HFA;VENTOLIN HFA) 108 (90 Base) MCG/ACT inhaler Inhale 2 puffs into the lungs every 4 (four) hours as needed for wheezing or shortness of breath. 10/30/16   Horton, Mayer Masker, MD  fluticasone (FLONASE) 50 MCG/ACT nasal spray Place 2 sprays into both nostrils daily.  10/30/16   Horton, Mayer Masker, MD  HYDROcodone-acetaminophen (NORCO/VICODIN) 5-325 MG tablet Take 1 tablet by mouth every 6 (six) hours as needed for severe pain. 12/25/17   Borghild Thaker C, PA-C  ibuprofen (ADVIL,MOTRIN) 600 MG tablet Take 1 tablet (600 mg total) by mouth every 6 (six) hours as needed. 12/25/17   Sherriann Szuch C, PA-C  lidocaine (LIDODERM) 5 % Place 1 patch onto the skin daily. Remove & Discard patch within 12 hours or as directed by MD 12/25/17   Carrieanne Kleen C, PA-C  methocarbamol (ROBAXIN) 500 MG tablet Take 1 tablet (500 mg total) by mouth 2 (two) times daily. 12/25/17   Mykhia Danish C, PA-C  naproxen (NAPROSYN) 500 MG tablet Take 1 tablet (500 mg total) by mouth 2 (two) times daily. 10/30/16   Horton, Mayer Masker, MD  predniSONE (DELTASONE) 20 MG tablet Take 2 tablets (40 mg total) by mouth daily. 10/30/16   Horton, Mayer Masker, MD    Family History History reviewed. No pertinent family history.  Social History Social History   Tobacco Use  . Smoking status: Former Smoker    Packs/day: 0.50    Types: Cigarettes  . Smokeless tobacco: Never Used  Substance Use Topics  . Alcohol use: No  . Drug use: Yes    Types: Marijuana     Allergies   Patient has no active allergies.   Review of Systems Review of Systems  Constitutional: Negative for  chills and fever.  Gastrointestinal: Negative for abdominal pain, nausea and vomiting.  Genitourinary: Negative for difficulty urinating.  Musculoskeletal: Positive for back pain.  Neurological: Negative for weakness and numbness.  All other systems reviewed and are negative.    Physical Exam Updated Vital Signs BP 110/63 (BP Location: Left Arm)   Pulse 63   Temp 97.6 F (36.4 C) (Oral)   Resp 17   SpO2 100%   Physical Exam  Constitutional: He appears well-developed and well-nourished. No distress.  HENT:  Head: Normocephalic and atraumatic.  Eyes: Conjunctivae are normal.  Neck: Neck supple.  Cardiovascular: Normal rate,  regular rhythm, normal heart sounds and intact distal pulses.  Pulmonary/Chest: Effort normal and breath sounds normal. No respiratory distress.  Abdominal: Soft. There is no tenderness. There is no guarding.  Musculoskeletal: He exhibits no edema.       Lumbar back: He exhibits tenderness.       Back:  Lymphadenopathy:    He has no cervical adenopathy.  Neurological: He is alert.  Sensation grossly intact to light touch in the lower extremities bilaterally. No saddle anesthesias. Strength 5/5 in the lower extremities. No noted gait deficit. Coordination intact with heel to shin testing.  Skin: Skin is warm and dry. He is not diaphoretic.  Psychiatric: He has a normal mood and affect. His behavior is normal.  Nursing note and vitals reviewed.    ED Treatments / Results  Labs (all labs ordered are listed, but only abnormal results are displayed) Labs Reviewed  I-STAT TROPONIN, ED  I-STAT CHEM 8, ED    EKG EKG Interpretation  Date/Time:  Tuesday December 25 2017 07:45:02 EST Ventricular Rate:  61 PR Interval:  152 QRS Duration: 94 QT Interval:  416 QTC Calculation: 418 R Axis:   86 Text Interpretation:  Normal sinus rhythm with sinus arrhythmia Moderate voltage criteria for LVH, may be normal variant ST elevation consider lateral injury or acute infarct Abnormal ECG New ST depression lead III and inverted T wave No chest pain currently Reconfirmed by Vanetta Mulders [phone]) on 12/25/2017 7:55:12 AM   Radiology No results found.  Procedures Procedures (including critical care time)  Medications Ordered in ED Medications  ibuprofen (ADVIL,MOTRIN) tablet 600 mg (600 mg Oral Given 12/25/17 0747)  methocarbamol (ROBAXIN) tablet 1,000 mg (1,000 mg Oral Given 12/25/17 0747)     Initial Impression / Assessment and Plan / ED Course  I have reviewed the triage vital signs and the nursing notes.  Pertinent labs & imaging results that were available during my care of the  patient were reviewed by me and considered in my medical decision making (see chart for details).     Patient presents with onset of back pain.  History and exam favor muscular origin.  No neurologic deficits. Patient also complains of intermittent chest pain.  He has no chest pain currently nor has he had it in the last several days. EKG reviewed with some ST changes, especially in lead III.  Troponin negative.  No acute abnormalities on Chem-8.  These results, including the EKG abnormalities, were reviewed with patient.  He was instructed to follow-up with cardiology on this matter. Strict return precautions discussed.  Patient voices understanding of these instructions, accepts the plan, and is comfortable with discharge.   Findings and plan of care discussed with Vanetta Mulders, MD. Dr. Deretha Emory personally evaluated and examined this patient.   Final Clinical Impressions(s) / ED Diagnoses   Final diagnoses:  Acute midline low back  pain without sciatica    ED Discharge Orders         Ordered    methocarbamol (ROBAXIN) 500 MG tablet  2 times daily     12/25/17 0727    lidocaine (LIDODERM) 5 %  Every 24 hours     12/25/17 0727    ibuprofen (ADVIL,MOTRIN) 600 MG tablet  Every 6 hours PRN     12/25/17 0727    HYDROcodone-acetaminophen (NORCO/VICODIN) 5-325 MG tablet  Every 6 hours PRN     12/25/17 0727           Anselm Pancoast, PA-C 12/25/17 0840    Vanetta Mulders, MD 12/25/17 [phone]

## 2017-12-25 NOTE — Discharge Instructions (Addendum)
Expect your soreness to increase over the next 2-3 days. Take it easy, but do not lay around too much as this may make any stiffness worse.  Antiinflammatory medications: Take 600 mg of ibuprofen every 6 hours or 440 mg (over the counter dose) to 500 mg (prescription dose) of naproxen every 12 hours for the next 3 days. After this time, these medications may be used as needed for pain. Take these medications with food to avoid upset stomach. Choose only one of these medications, do not take them together. Acetaminophen (generic for Tylenol): Should you continue to have additional pain while taking the ibuprofen or naproxen, you may add in acetaminophen as needed. Your daily total maximum amount of acetaminophen from all sources should be limited to 4000mg /day for persons without liver problems, or 2000mg /day for those with liver problems. Muscle relaxer: Robaxin is a muscle relaxer and may help loosen stiff muscles. Do not take the Robaxin while driving or performing other dangerous activities.  Lidocaine patches: These are available via either prescription or over-the-counter. The over-the-counter option may be more economical one and are likely just as effective. There are multiple over-the-counter brands, such as Salonpas. Exercises: Be sure to perform the attached exercises starting with three times a week and working up to performing them daily. This is an essential part of preventing long term problems.  Follow up: Follow up with a primary care provider for any future management of these complaints. Be sure to follow up within 7-10 days. Cardiology follow-up: There were some changes noted to the EKG today when compared to previous EKG.  It is recommended that you follow-up with cardiology on this matter as soon as possible.  Call the number provided to set up an appointment. Return: Return to the ED should symptoms worsen.   Please also follow up with a primary care provider for further evaluation  of the intermittent chest pain.  For prescription assistance, may try using prescription discount sites or apps, such as goodrx.com

## 2017-12-25 NOTE — ED Provider Notes (Signed)
Medical screening examination/treatment/procedure(s) were conducted as a shared visit with non-physician practitioner(s) and myself.  I personally evaluated the patient during the encounter.  EKG Interpretation  Date/Time:  Tuesday December 25 2017 07:45:02 EST Ventricular Rate:  61 PR Interval:  152 QRS Duration: 94 QT Interval:  416 QTC Calculation: 418 R Axis:   86 Text Interpretation:  Normal sinus rhythm with sinus arrhythmia Moderate voltage criteria for LVH, may be normal variant ST elevation consider lateral injury or acute infarct Abnormal ECG New ST depression lead III and inverted T wave No chest pain currently Reconfirmed by Vanetta Mulders [phone]) on 12/25/2017 7:55:12 AM  Patient seen by me along with physician assistant.  Patient with a complaint of some low back pain more to the right that occurred yesterday when he was changing a tire.  Felt a pop in his back.  There is no radiation of any pain into the lower extremities no numbness or weakness to his feet or legs.  In addition patient talks about having chest pain none currently.  Today's EKG has some changes compared to one from 2017.  He has inverted T waves in lead III and ST segment depression in lead III.  In addition has some new ST segment elevation in aVL.  The anterior lateral leads similar to in the past but a little more prominent ST elevation in V2.  The lateral and anterior leads could represent early re-pole.  But based on the changes in the inferior lead III.  We will recommend a point-of-care troponin.  Patient has a strong family history of early coronary artery disease.  However patient is young and as stated no chest pain currently.  Treatment for the low back pain can be symptomatic.  If troponin is negative patient can receive referral to cardiology for follow-up.    Vanetta Mulders, MD 12/25/17 [phone]

## 2017-12-25 NOTE — ED Triage Notes (Signed)
Pt coming from home by ems. Pt yestarday was changing a tire and hurd a pop in his back didn't really do anything for at that time. But this morning had severe pain, pt states he couldn't go to work due to him having to move side hurts the patient. Pt denies any neuro deficits.

## 2017-12-25 NOTE — ED Notes (Signed)
Patient is A&Ox4.  No signs of distress noted.  Please see providers complete history and physical exam.  

## 2018-01-01 ENCOUNTER — Encounter: Payer: Self-pay | Admitting: Cardiology

## 2018-01-01 ENCOUNTER — Telehealth: Payer: Self-pay | Admitting: Cardiology

## 2018-01-01 NOTE — Telephone Encounter (Signed)
Called pt and left message asking pt to call back to get additional information concerning pt's previous provider.

## 2018-01-22 ENCOUNTER — Ambulatory Visit (INDEPENDENT_AMBULATORY_CARE_PROVIDER_SITE_OTHER): Payer: Self-pay | Admitting: Cardiology

## 2018-01-22 ENCOUNTER — Encounter (INDEPENDENT_AMBULATORY_CARE_PROVIDER_SITE_OTHER): Payer: Self-pay

## 2018-01-22 ENCOUNTER — Encounter: Payer: Self-pay | Admitting: Cardiology

## 2018-01-22 VITALS — BP 114/60 | HR 60 | Ht 68.0 in | Wt 139.0 lb

## 2018-01-22 DIAGNOSIS — Z0189 Encounter for other specified special examinations: Secondary | ICD-10-CM

## 2018-01-22 DIAGNOSIS — R6889 Other general symptoms and signs: Secondary | ICD-10-CM

## 2018-01-22 DIAGNOSIS — R0789 Other chest pain: Secondary | ICD-10-CM

## 2018-01-22 DIAGNOSIS — R9431 Abnormal electrocardiogram [ECG] [EKG]: Secondary | ICD-10-CM

## 2018-01-22 LAB — LIPID PANEL
CHOLESTEROL TOTAL: 138 mg/dL (ref 100–199)
Chol/HDL Ratio: 2.8 ratio (ref 0.0–5.0)
HDL: 50 mg/dL (ref >39)
LDL CALC: 78 mg/dL (ref 0–99)
TRIGLYCERIDES: 48 mg/dL (ref 0–149)
VLDL Cholesterol Cal: 10 mg/dL (ref 5–40)

## 2018-01-22 LAB — TSH: TSH: 1.77 u[IU]/mL (ref 0.450–4.500)

## 2018-01-22 LAB — T4, FREE: FREE T4: 1.28 ng/dL (ref 0.82–1.77)

## 2018-01-22 NOTE — Progress Notes (Signed)
Cardiology Office Note:    Date:  01/22/2018   ID:  Christian CrouchElwood O Arias, DOB 10/19/1995, MRN [id number]  PCP:  Patient, No Pcp Per  Cardiologist:  Donato SchultzMark Kimberely Mccannon, MD  Electrophysiologist:  None   Referring MD: No ref. provider found     History of Present Illness:    Christian Arias is a 22 y.o. male here for evaluated of abnormal EKG, chest pain at the request of Dr. Deretha EmoryZackowski.  He is a smoker.  One pack per day.  In review of the emergency department notes from 12/25/2017, new ST depression in lead III and inverted T waves were noted.  There was also moderate voltage criteria for LVH and consideration of lateral injury or acute infarct were noted on EKG interpretation.  These are some changes from 2017.  Thought was that this likely represented early repolarization.  Point-of-care troponin was drawn because he had a strong family history of early coronary artery disease.  He was not complaining of any chest pain at that time.  Mostly he was in the immediate ER for treatment of low back pain.  His troponin was negative.  He did complain of some intermittent chest discomfort that occurred prior to his ER visit central low chest sharp lasting a few seconds nonradiating.  That has been going on for several years.  No changes. Sometimes cold intolerance. Mother died. Father is patient here with Dr. Jens Somrenshaw.   He also has a history of seizures.   Past Medical History:  Diagnosis Date  . Seizures (HCC)     History reviewed. No pertinent surgical history.  Current Medications: Current Meds  Medication Sig  . acetaminophen (TYLENOL) 500 MG tablet Take 1,000 mg by mouth every 6 (six) hours as needed for mild pain, moderate pain or headache.  . albuterol (PROVENTIL HFA;VENTOLIN HFA) 108 (90 Base) MCG/ACT inhaler Inhale 2 puffs into the lungs every 4 (four) hours as needed for wheezing or shortness of breath.  Marland Kitchen. HYDROcodone-acetaminophen (NORCO/VICODIN) 5-325 MG tablet Take 1 tablet by mouth  every 6 (six) hours as needed for severe pain.  Marland Kitchen. ibuprofen (ADVIL,MOTRIN) 600 MG tablet Take 1 tablet (600 mg total) by mouth every 6 (six) hours as needed.  . methocarbamol (ROBAXIN) 500 MG tablet Take 1 tablet (500 mg total) by mouth 2 (two) times daily.     Allergies:   Patient has no active allergies.   Social History   Socioeconomic History  . Marital status: Single    Spouse name: Not on file  . Number of children: Not on file  . Years of education: Not on file  . Highest education level: Not on file  Occupational History  . Not on file  Social Needs  . Financial resource strain: Not on file  . Food insecurity:    Worry: Not on file    Inability: Not on file  . Transportation needs:    Medical: Not on file    Non-medical: Not on file  Tobacco Use  . Smoking status: Former Smoker    Packs/day: 0.50    Types: Cigarettes  . Smokeless tobacco: Never Used  Substance and Sexual Activity  . Alcohol use: No  . Drug use: Yes    Types: Marijuana  . Sexual activity: Not on file  Lifestyle  . Physical activity:    Days per week: Not on file    Minutes per session: Not on file  . Stress: Not on file  Relationships  . Social connections:  Talks on phone: Not on file    Gets together: Not on file    Attends religious service: Not on file    Active member of club or organization: Not on file    Attends meetings of clubs or organizations: Not on file    Relationship status: Not on file  Other Topics Concern  . Not on file  Social History Narrative  . Not on file     Family History: The patient's family history is not on file.  He has an early family history of coronary artery disease  ROS:   Please see the history of present illness.    Denies any fevers chills nausea vomiting syncope bleeding all other systems reviewed and are negative.  EKGs/Labs/Other Studies Reviewed:    The following studies were reviewed today: Prior ER note, lab work reviewed, creatinine  1.0 troponin was normal hemoglobin 15.3.  EKG reviewed  EKG:  EKG is not ordered today.  Prior EKG shows sinus rhythm with left ventricular hypertrophy early repolarization abnormality some T wave inversion in the inferior leads.  Recent Labs: 12/25/2017: BUN 16; Creatinine, Ser 1.00; Hemoglobin 15.3; Potassium 3.9; Sodium 140  Recent Lipid Panel No results found for: CHOL, TRIG, HDL, CHOLHDL, VLDL, LDLCALC, LDLDIRECT  Physical Exam:    VS:  BP 114/60   Pulse 60   Ht 5\' 8"  (1.727 m)   Wt 139 lb (63 kg)   BMI 21.13 kg/m     Wt Readings from Last 3 Encounters:  01/22/18 139 lb (63 kg)  04/05/17 140 lb (63.5 kg)  10/30/16 138 lb (62.6 kg)     GEN:  Well nourished, well developed in no acute distress HEENT: Normal NECK: No JVD; No carotid bruits LYMPHATICS: No lymphadenopathy CARDIAC: RRR, no murmurs, rubs, gallops RESPIRATORY:  Clear to auscultation without rales, wheezing or rhonchi  ABDOMEN: Soft, non-tender, non-distended MUSCULOSKELETAL:  No edema; No deformity  SKIN: Warm and dry NEUROLOGIC:  Alert and oriented x 3 PSYCHIATRIC:  Normal affect   ASSESSMENT:    1. Other chest pain   2. Abnormal EKG   3. Cold intolerance   4. Laboratory examination    PLAN:    In order of problems listed above:  Abnormal EKG -Given his abnormal EKG showing repolarization abnormality LVH pattern T wave inversion in the inferior leads, I think it makes sense for [place] to go ahead and proceed with echocardiogram to ensure that he does not have any evidence of hypertrophic cardiomyopathy.  Chest pain -Not currently having any chest discomfort.  Was having some chest pain intermittently.  He was having some low back pain which brought him to the emergency room in early November.  I will check a lipid panel.  Tobacco use -1 pack/day.  Strongly encourage cessation.  He is thinking about getting a patch.  Blame him the importance of tobacco cessation  Cold intolerance -We will check a TSH,  free T4, sometimes in the summer he will suddenly get a chill, he has to wear a coat.  He is quite thin.  Excellent hemoglobin 15.3, creatinine 1.0.  Medication Adjustments/Labs and Tests Ordered: Current medicines are reviewed at length with the patient today.  Concerns regarding medicines are outlined above.  Orders Placed This Encounter  Procedures  . TSH  . T4, free  . Lipid panel  . EKG 12-Lead  . ECHOCARDIOGRAM COMPLETE   No orders of the defined types were placed in this encounter.   Patient Instructions  Medication  Instructions:  The current medical regimen is effective;  continue present plan and medications.  If you need a refill on your cardiac medications before your next appointment, please call your pharmacy.   Lab work: Please have blood work today (TSH, Free T4, Lipid) If you have labs (blood work) drawn today and your tests are completely normal, you will receive your results only by: Marland Kitchen MyChart Message (if you have MyChart) OR . A paper copy in the mail If you have any lab test that is abnormal or we need to change your treatment, we will call you to review the results.  Testing/Procedures: Your physician has requested that you have an echocardiogram. Echocardiography is a painless test that uses sound waves to create images of your heart. It provides your doctor with information about the size and shape of your heart and how well your heart's chambers and valves are working. This procedure takes approximately one hour. There are no restrictions for this procedure.   Smoking Tobacco Information Smoking tobacco will very likely harm your health. Tobacco contains a poisonous (toxic), colorless chemical called nicotine. Nicotine affects the brain and makes tobacco addictive. This change in your brain can make it hard to stop smoking. Tobacco also has other toxic chemicals that can hurt your body and raise your risk of many cancers. How can smoking tobacco affect  me? Smoking tobacco can increase your chances of having serious health conditions, such as:  Cancer. Smoking is most commonly associated with lung cancer, but can lead to cancer in other parts of the body.  Chronic obstructive pulmonary disease (COPD). This is a long-term lung condition that makes it hard to breathe. It also gets worse over time.  High blood pressure (hypertension), heart disease, stroke, or heart attack.  Lung infections, such as pneumonia.  Cataracts. This is when the lenses in the eyes become clouded.  Digestive problems. This may include peptic ulcers, heartburn, and gastroesophageal reflux disease (GERD).  Oral health problems, such as gum disease and tooth loss.  Loss of taste and smell.  Smoking can affect your appearance by causing:  Wrinkles.  Yellow or stained teeth, fingers, and fingernails.  Smoking tobacco can also affect your social life.  Many workplaces, [employer], hotels, and public places are tobacco-free. This means that you may experience challenges in finding places to smoke when away from home.  The cost of a smoking habit can be expensive. Expenses for someone who smokes come in two ways: ? You spend money on a regular basis to buy tobacco. ? Your health care costs in the long-term are higher if you smoke.  Tobacco smoke can also affect the health of those around you. Children of smokers have greater chances of: ? Sudden infant death syndrome (SIDS). ? Ear infections. ? Lung infections.  What lifestyle changes can be made?  Do not start smoking. Quit if you already do.  To quit smoking: ? Make a plan to quit smoking and commit yourself to it. Look for programs to help you and ask your health care provider for recommendations and ideas. ? Talk with your health care provider about using nicotine replacement medicines to help you quit. Medicine replacement medicines include gum, lozenges, patches, sprays, or pills. ? Do not replace  cigarette smoking with electronic cigarettes, which are commonly called e-cigarettes. The safety of e-cigarettes is not known, and some may contain harmful chemicals. ? Avoid places, people, or situations that tempt you to smoke. ? If you try to quit  but return to smoking, don't give up hope. It is very common for people to try a number of times before they fully succeed. When you feel ready again, give it another try.  Quitting smoking might affect the way you eat as well as your weight. Be prepared to monitor your eating habits. Get support in planning and following a healthy diet.  Ask your health care provider about having regular tests (screenings) to check for cancer. This may include blood tests, imaging tests, and other tests.  Exercise regularly. Consider taking walks, joining a gym, or doing yoga or exercise classes.  Develop skills to manage your stress. These skills include meditation. What are the benefits of quitting smoking? By quitting smoking, you may:  Lower your risk of getting cancer and other diseases caused by smoking.  Live longer.  Breathe better.  Lower your blood pressure and heart rate.  Stop your addiction to tobacco.  Stop creating secondhand smoke that hurts other people.  Improve your sense of taste and smell.  Look better over time, due to having fewer wrinkles and less staining.  What can happen if changes are not made? If you do not stop smoking, you may:  Get cancer and other diseases.  Develop COPD or other long-term (chronic) lung conditions.  Develop serious problems with your heart and blood vessels (cardiovascular system).  Need more tests to screen for problems caused by smoking.  Have higher, long-term healthcare costs from medicines or treatments related to smoking.  Continue to have worsening changes in your lungs, mouth, and nose.  Where to find support: To get support to quit smoking, consider:  Asking your health care  provider for more information and resources.  Taking classes to learn more about quitting smoking.  Looking for local organizations that offer resources about quitting smoking.  Joining a support group for people who want to quit smoking in your local community.  Where to find more information: You may find more information about quitting smoking from:  HelpGuide.org: www.helpguide.org/articles/addictions/how-to-quit-smoking.htm  BankRights.uy: smokefree.gov  American Lung Association: www.lung.org  Contact a health care provider if:  You have problems breathing.  Your lips, nose, or fingers turn blue.  You have chest pain.  You are coughing up blood.  You feel faint or you pass out.  You have other noticeable changes that cause you to worry. Summary  Smoking tobacco can negatively affect your health, the health of those around you, your finances, and your social life.  Do not start smoking. Quit if you already do. If you need help quitting, ask your health care provider.  Think about joining a support group for people who want to quit smoking in your local community. There are many effective programs that will help you to quit this behavior. This information is not intended to replace advice given to you by your health care provider. Make sure you discuss any questions you have with your health care provider. Document Released: 02/22/2016 Document Revised: 02/22/2016 Document Reviewed: 02/22/2016 Elsevier Interactive Patient Education  2018 [employer].  Follow-Up: . Follow up as needed after the above testing.  Thank you for choosing [facility]!!         Signed, Donato Schultz, MD  01/22/2018 10:55 AM     [facility]

## 2018-01-22 NOTE — Patient Instructions (Signed)
Medication Instructions:  The current medical regimen is effective;  continue present plan and medications.  If you need a refill on your cardiac medications before your next appointment, please call your pharmacy.   Lab work: Please have blood work today (TSH, Free T4, Lipid) If you have labs (blood work) drawn today and your tests are completely normal, you will receive your results only by: Marland Kitchen MyChart Message (if you have MyChart) OR . A paper copy in the mail If you have any lab test that is abnormal or we need to change your treatment, we will call you to review the results.  Testing/Procedures: Your physician has requested that you have an echocardiogram. Echocardiography is a painless test that uses sound waves to create images of your heart. It provides your doctor with information about the size and shape of your heart and how well your heart's chambers and valves are working. This procedure takes approximately one hour. There are no restrictions for this procedure.   Smoking Tobacco Information Smoking tobacco will very likely harm your health. Tobacco contains a poisonous (toxic), colorless chemical called nicotine. Nicotine affects the brain and makes tobacco addictive. This change in your brain can make it hard to stop smoking. Tobacco also has other toxic chemicals that can hurt your body and raise your risk of many cancers. How can smoking tobacco affect me? Smoking tobacco can increase your chances of having serious health conditions, such as:  Cancer. Smoking is most commonly associated with lung cancer, but can lead to cancer in other parts of the body.  Chronic obstructive pulmonary disease (COPD). This is a long-term lung condition that makes it hard to breathe. It also gets worse over time.  High blood pressure (hypertension), heart disease, stroke, or heart attack.  Lung infections, such as pneumonia.  Cataracts. This is when the lenses in the eyes become  clouded.  Digestive problems. This may include peptic ulcers, heartburn, and gastroesophageal reflux disease (GERD).  Oral health problems, such as gum disease and tooth loss.  Loss of taste and smell.  Smoking can affect your appearance by causing:  Wrinkles.  Yellow or stained teeth, fingers, and fingernails.  Smoking tobacco can also affect your social life.  Many workplaces, [employer], hotels, and public places are tobacco-free. This means that you may experience challenges in finding places to smoke when away from home.  The cost of a smoking habit can be expensive. Expenses for someone who smokes come in two ways: ? You spend money on a regular basis to buy tobacco. ? Your health care costs in the long-term are higher if you smoke.  Tobacco smoke can also affect the health of those around you. Children of smokers have greater chances of: ? Sudden infant death syndrome (SIDS). ? Ear infections. ? Lung infections.  What lifestyle changes can be made?  Do not start smoking. Quit if you already do.  To quit smoking: ? Make a plan to quit smoking and commit yourself to it. Look for programs to help you and ask your health care provider for recommendations and ideas. ? Talk with your health care provider about using nicotine replacement medicines to help you quit. Medicine replacement medicines include gum, lozenges, patches, sprays, or pills. ? Do not replace cigarette smoking with electronic cigarettes, which are commonly called e-cigarettes. The safety of e-cigarettes is not known, and some may contain harmful chemicals. ? Avoid places, people, or situations that tempt you to smoke. ? If you try to quit  but return to smoking, don't give up hope. It is very common for people to try a number of times before they fully succeed. When you feel ready again, give it another try.  Quitting smoking might affect the way you eat as well as your weight. Be prepared to monitor your  eating habits. Get support in planning and following a healthy diet.  Ask your health care provider about having regular tests (screenings) to check for cancer. This may include blood tests, imaging tests, and other tests.  Exercise regularly. Consider taking walks, joining a gym, or doing yoga or exercise classes.  Develop skills to manage your stress. These skills include meditation. What are the benefits of quitting smoking? By quitting smoking, you may:  Lower your risk of getting cancer and other diseases caused by smoking.  Live longer.  Breathe better.  Lower your blood pressure and heart rate.  Stop your addiction to tobacco.  Stop creating secondhand smoke that hurts other people.  Improve your sense of taste and smell.  Look better over time, due to having fewer wrinkles and less staining.  What can happen if changes are not made? If you do not stop smoking, you may:  Get cancer and other diseases.  Develop COPD or other long-term (chronic) lung conditions.  Develop serious problems with your heart and blood vessels (cardiovascular system).  Need more tests to screen for problems caused by smoking.  Have higher, long-term healthcare costs from medicines or treatments related to smoking.  Continue to have worsening changes in your lungs, mouth, and nose.  Where to find support: To get support to quit smoking, consider:  Asking your health care provider for more information and resources.  Taking classes to learn more about quitting smoking.  Looking for local organizations that offer resources about quitting smoking.  Joining a support group for people who want to quit smoking in your local community.  Where to find more information: You may find more information about quitting smoking from:  HelpGuide.org: www.helpguide.org/articles/addictions/how-to-quit-smoking.htm  BankRights.uySmokefree.gov: smokefree.gov  American Lung Association: www.lung.org  Contact  a health care provider if:  You have problems breathing.  Your lips, nose, or fingers turn blue.  You have chest pain.  You are coughing up blood.  You feel faint or you pass out.  You have other noticeable changes that cause you to worry. Summary  Smoking tobacco can negatively affect your health, the health of those around you, your finances, and your social life.  Do not start smoking. Quit if you already do. If you need help quitting, ask your health care provider.  Think about joining a support group for people who want to quit smoking in your local community. There are many effective programs that will help you to quit this behavior. This information is not intended to replace advice given to you by your health care provider. Make sure you discuss any questions you have with your health care provider. Document Released: 02/22/2016 Document Revised: 02/22/2016 Document Reviewed: 02/22/2016 Elsevier Interactive Patient Education  2018 [employer]  Follow-Up: . Follow up as needed after the above testing.  Thank you for choosing [facility]!!

## 2018-01-29 ENCOUNTER — Encounter: Payer: Self-pay | Admitting: *Deleted

## 2018-02-25 ENCOUNTER — Other Ambulatory Visit: Payer: Self-pay

## 2018-02-25 ENCOUNTER — Encounter (HOSPITAL_COMMUNITY): Payer: Self-pay | Admitting: Emergency Medicine

## 2018-02-25 ENCOUNTER — Emergency Department (HOSPITAL_COMMUNITY)
Admission: EM | Admit: 2018-02-25 | Discharge: 2018-02-25 | Disposition: A | Discharge status: Home / Self Care | Payer: Self-pay | Attending: Emergency Medicine | Admitting: Emergency Medicine

## 2018-02-25 DIAGNOSIS — Z87891 Personal history of nicotine dependence: Secondary | ICD-10-CM | POA: Insufficient documentation

## 2018-02-25 DIAGNOSIS — J111 Influenza due to unidentified influenza virus with other respiratory manifestations: Secondary | ICD-10-CM

## 2018-02-25 DIAGNOSIS — Z79899 Other long term (current) drug therapy: Secondary | ICD-10-CM | POA: Insufficient documentation

## 2018-02-25 DIAGNOSIS — J101 Influenza due to other identified influenza virus with other respiratory manifestations: Secondary | ICD-10-CM | POA: Insufficient documentation

## 2018-02-25 MED ORDER — ONDANSETRON HCL 4 MG/2ML IJ SOLN
4.0000 mg | Freq: Once | INTRAMUSCULAR | Status: AC
  Administered 2018-02-25: 4 mg via INTRAVENOUS
  Filled 2018-02-25: qty 2

## 2018-02-25 MED ORDER — SODIUM CHLORIDE 0.9 % IV BOLUS
1000.0000 mL | Freq: Once | INTRAVENOUS | Status: AC
  Administered 2018-02-25: 1000 mL via INTRAVENOUS

## 2018-02-25 MED ORDER — PROMETHAZINE-DM 6.25-15 MG/5ML PO SYRP: 10.0000 mL | ORAL_SOLUTION | Freq: Four times a day (QID) | ORAL | 0 refills | Status: DC | PRN

## 2018-02-25 MED ORDER — PROMETHAZINE HCL 25 MG/ML IJ SOLN
25.0000 mg | Freq: Once | INTRAMUSCULAR | Status: AC
  Administered 2018-02-25: 25 mg via INTRAVENOUS
  Filled 2018-02-25: qty 1

## 2018-02-25 MED ORDER — IBUPROFEN 800 MG PO TABS: 800.0000 mg | ORAL_TABLET | Freq: Three times a day (TID) | ORAL | 0 refills | Status: DC | PRN

## 2018-02-25 MED ORDER — GUAIFENESIN ER 1200 MG PO TB12: 1.0000 | ORAL_TABLET | Freq: Two times a day (BID) | ORAL | 0 refills | Status: DC

## 2018-02-25 NOTE — ED Triage Notes (Signed)
Arrived via EMS onset one day ago continued today productive cough green yellow sputum, fever, and coughed a lot emesis x1.

## 2018-02-25 NOTE — Discharge Instructions (Signed)
Return here as needed. Increase your fluid intake and rest as much as possible. Tylenol as well for fever.

## 2018-02-27 ENCOUNTER — Other Ambulatory Visit (HOSPITAL_COMMUNITY): Payer: Self-pay

## 2018-02-28 NOTE — ED Provider Notes (Signed)
[facility] EMERGENCY DEPARTMENT Provider Note   CSN: [id number] Arrival date & time: 02/25/18  1349     History   Chief Complaint Chief Complaint  Patient presents with  . Cough  . Fever  . Emesis    HPI Christian Arias is a 23 y.o. male.  HPI Patient presents to the emergency department with a one-day history of cough productive yellow sputum, fever, body aches.  She states that he did not take any medication prior to arrival for his symptoms.  Patient states that nothing seems make the condition better or worse.  The patient denies chest pain, shortness of breath, headache,blurred vision, neck pain,weakness, numbness, dizziness, anorexia, edema, abdominal pain, nausea, vomiting, diarrhea, rash, back pain, dysuria,near syncope, or syncope. Past Medical History:  Diagnosis Date  . Seizures (HCC)     There are no active problems to display for this patient.   History reviewed. No pertinent surgical history.      Home Medications    Prior to Admission medications   Medication Sig Start Date End Date Taking? Authorizing Provider  acetaminophen (TYLENOL) 500 MG tablet Take 1,000 mg by mouth every 6 (six) hours as needed for mild pain, moderate pain or headache.    [provider]  albuterol (PROVENTIL HFA;VENTOLIN HFA) 108 (90 Base) MCG/ACT inhaler Inhale 2 puffs into the lungs every 4 (four) hours as needed for wheezing or shortness of breath. 10/30/16   Horton, Mayer Masker, MD  Guaifenesin 1200 MG TB12 Take 1 tablet (1,200 mg total) by mouth 2 (two) times daily. 02/25/18   Xochilt Conant, Cristal Deer, PA-C  HYDROcodone-acetaminophen (NORCO/VICODIN) 5-325 MG tablet Take 1 tablet by mouth every 6 (six) hours as needed for severe pain. 12/25/17   Joy, Shawn C, PA-C  ibuprofen (ADVIL,MOTRIN) 800 MG tablet Take 1 tablet (800 mg total) by mouth every 8 (eight) hours as needed. 02/25/18   Davonn Flanery, Cristal Deer, PA-C  methocarbamol (ROBAXIN) 500 MG tablet Take 1 tablet  (500 mg total) by mouth 2 (two) times daily. 12/25/17   Joy, Shawn C, PA-C  promethazine-dextromethorphan (PROMETHAZINE-DM) 6.25-15 MG/5ML syrup Take 10 mLs by mouth 4 (four) times daily as needed for cough. 02/25/18   May Manrique, Cristal Deer, PA-C    Family History No family history on file.  Social History Social History   Tobacco Use  . Smoking status: Former Smoker    Packs/day: 0.50    Types: Cigarettes  . Smokeless tobacco: Never Used  Substance Use Topics  . Alcohol use: No  . Drug use: Yes    Types: Marijuana     Allergies   Patient has no active allergies.   Review of Systems Review of Systems All other systems negative except as documented in the HPI. All pertinent positives and negatives as reviewed in the HPI.  Physical Exam Updated Vital Signs BP 110/80   Pulse 90   Temp 99.2 F (37.3 C) (Oral)   Resp 18   Ht 5\' 8"  (1.727 m)   Wt 63.5 kg   SpO2 99%   BMI 21.29 kg/m   Physical Exam Vitals signs and nursing note reviewed.  Constitutional:      General: He is not in acute distress.    Appearance: He is well-developed.  HENT:     Head: Normocephalic and atraumatic.  Eyes:     Pupils: Pupils are equal, round, and reactive to light.  Neck:     Musculoskeletal: Normal range of motion and neck supple.  Cardiovascular:  Rate and Rhythm: Normal rate and regular rhythm.     Heart sounds: Normal heart sounds. No murmur. No friction rub. No gallop.   Pulmonary:     Effort: Pulmonary effort is normal. No respiratory distress.     Breath sounds: Normal breath sounds. No wheezing.  Abdominal:     General: Bowel sounds are normal. There is no distension.     Palpations: Abdomen is soft.     Tenderness: There is no abdominal tenderness.  Skin:    General: Skin is warm and dry.     Capillary Refill: Capillary refill takes less than 2 seconds.     Findings: No erythema or rash.  Neurological:     Mental Status: He is alert and oriented to person, place, and  time.     Motor: No abnormal muscle tone.     Coordination: Coordination normal.  Psychiatric:        Behavior: Behavior normal.      ED Treatments / Results  Labs (all labs ordered are listed, but only abnormal results are displayed) Labs Reviewed - No data to display  EKG None  Radiology No results found.  Procedures Procedures (including critical care time)  Medications Ordered in ED Medications  sodium chloride 0.9 % bolus 1,000 mL (0 mLs Intravenous Stopped 02/25/18 1653)  ondansetron (ZOFRAN) injection 4 mg (4 mg Intravenous Given 02/25/18 1553)  promethazine (PHENERGAN) injection 25 mg (25 mg Intravenous Given 02/25/18 1800)  sodium chloride 0.9 % bolus 1,000 mL (0 mLs Intravenous Stopped 02/25/18 1929)     Initial Impression / Assessment and Plan / ED Course  I have reviewed the triage vital signs and the nursing notes.  Pertinent labs & imaging results that were available during my care of the patient were reviewed by me and considered in my medical decision making (see chart for details).     Patient had vomiting associated with these influenza symptoms.  Patient was given IV fluids and antiemetics here in the emergency department.  Patient is feeling some better and will be discharged home.  Told to return here as needed.  Told to rest as much as possible and increase his fluid intake.  Final Clinical Impressions(s) / ED Diagnoses   Final diagnoses:  Influenza    ED Discharge Orders         Ordered    promethazine-dextromethorphan (PROMETHAZINE-DM) 6.25-15 MG/5ML syrup  4 times daily PRN     02/25/18 1923    ibuprofen (ADVIL,MOTRIN) 800 MG tablet  Every 8 hours PRN     02/25/18 1923    Guaifenesin 1200 MG TB12  2 times daily     02/25/18 1923           Charlestine NightLawyer, Egbert Seidel, PA-C 02/28/18 0009    Sabas SousBero, Michael M, MD 03/01/18 1200

## 2018-03-05 ENCOUNTER — Encounter: Payer: Self-pay | Admitting: Cardiology

## 2018-03-22 ENCOUNTER — Telehealth: Payer: Self-pay

## 2018-03-22 NOTE — Telephone Encounter (Signed)
New message    Just an FYI. We have made several attempts to contact this patient including sending a letter to schedule or reschedule their echocardiogram. We will be removing the patient from the echo WQ.   Thank you 

## 2018-03-25 NOTE — Telephone Encounter (Signed)
Noted! Thank you

## 2018-10-01 IMAGING — CR DG CHEST 2V
2 series · 2 of 2 positions shown · non-contrast
Comparison: 12/05/2015

CLINICAL DATA: Cough, congestion, and fever for 24 hours. Shortness
of breath.

EXAM:
CHEST  2 VIEW

[w chest pa]
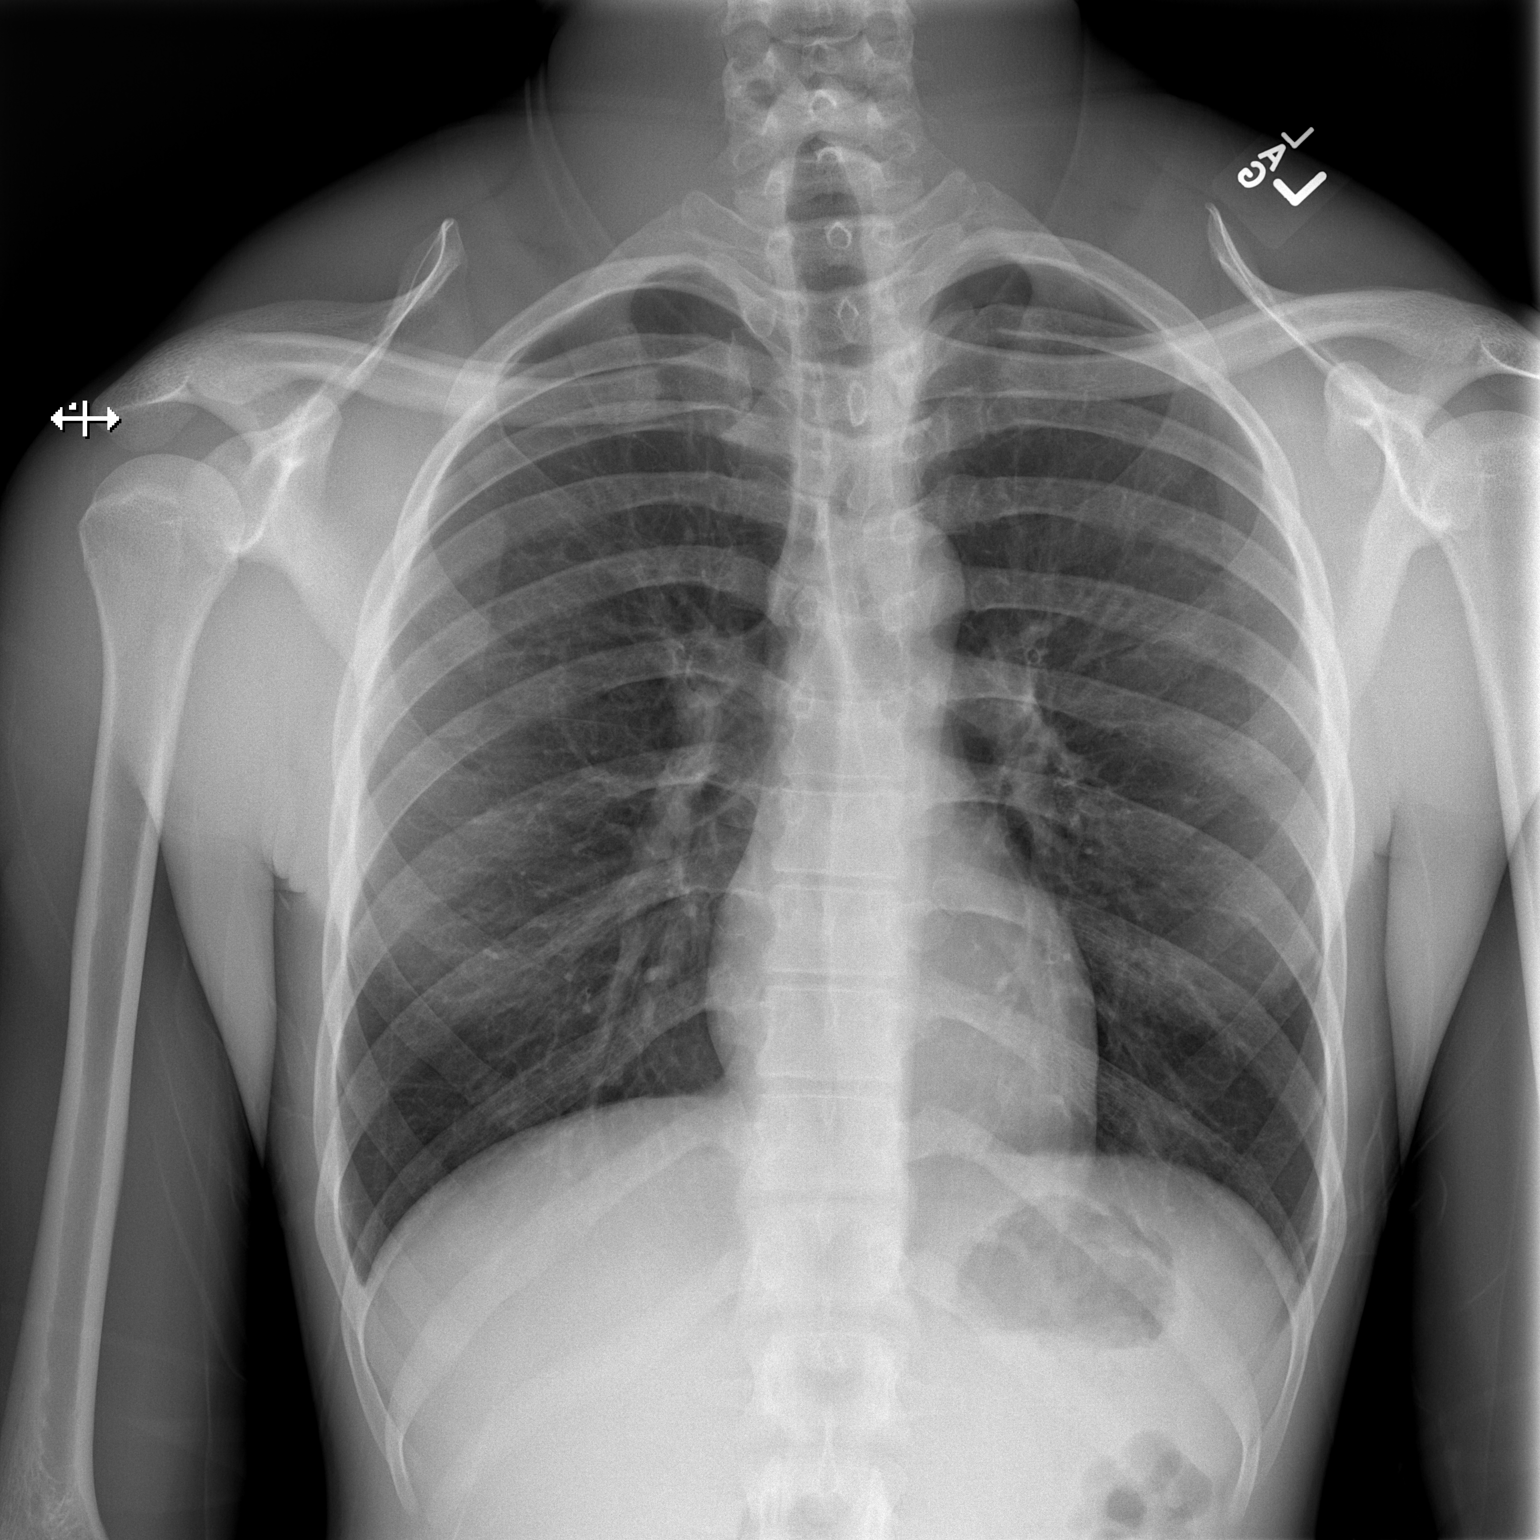

[w chest lat]
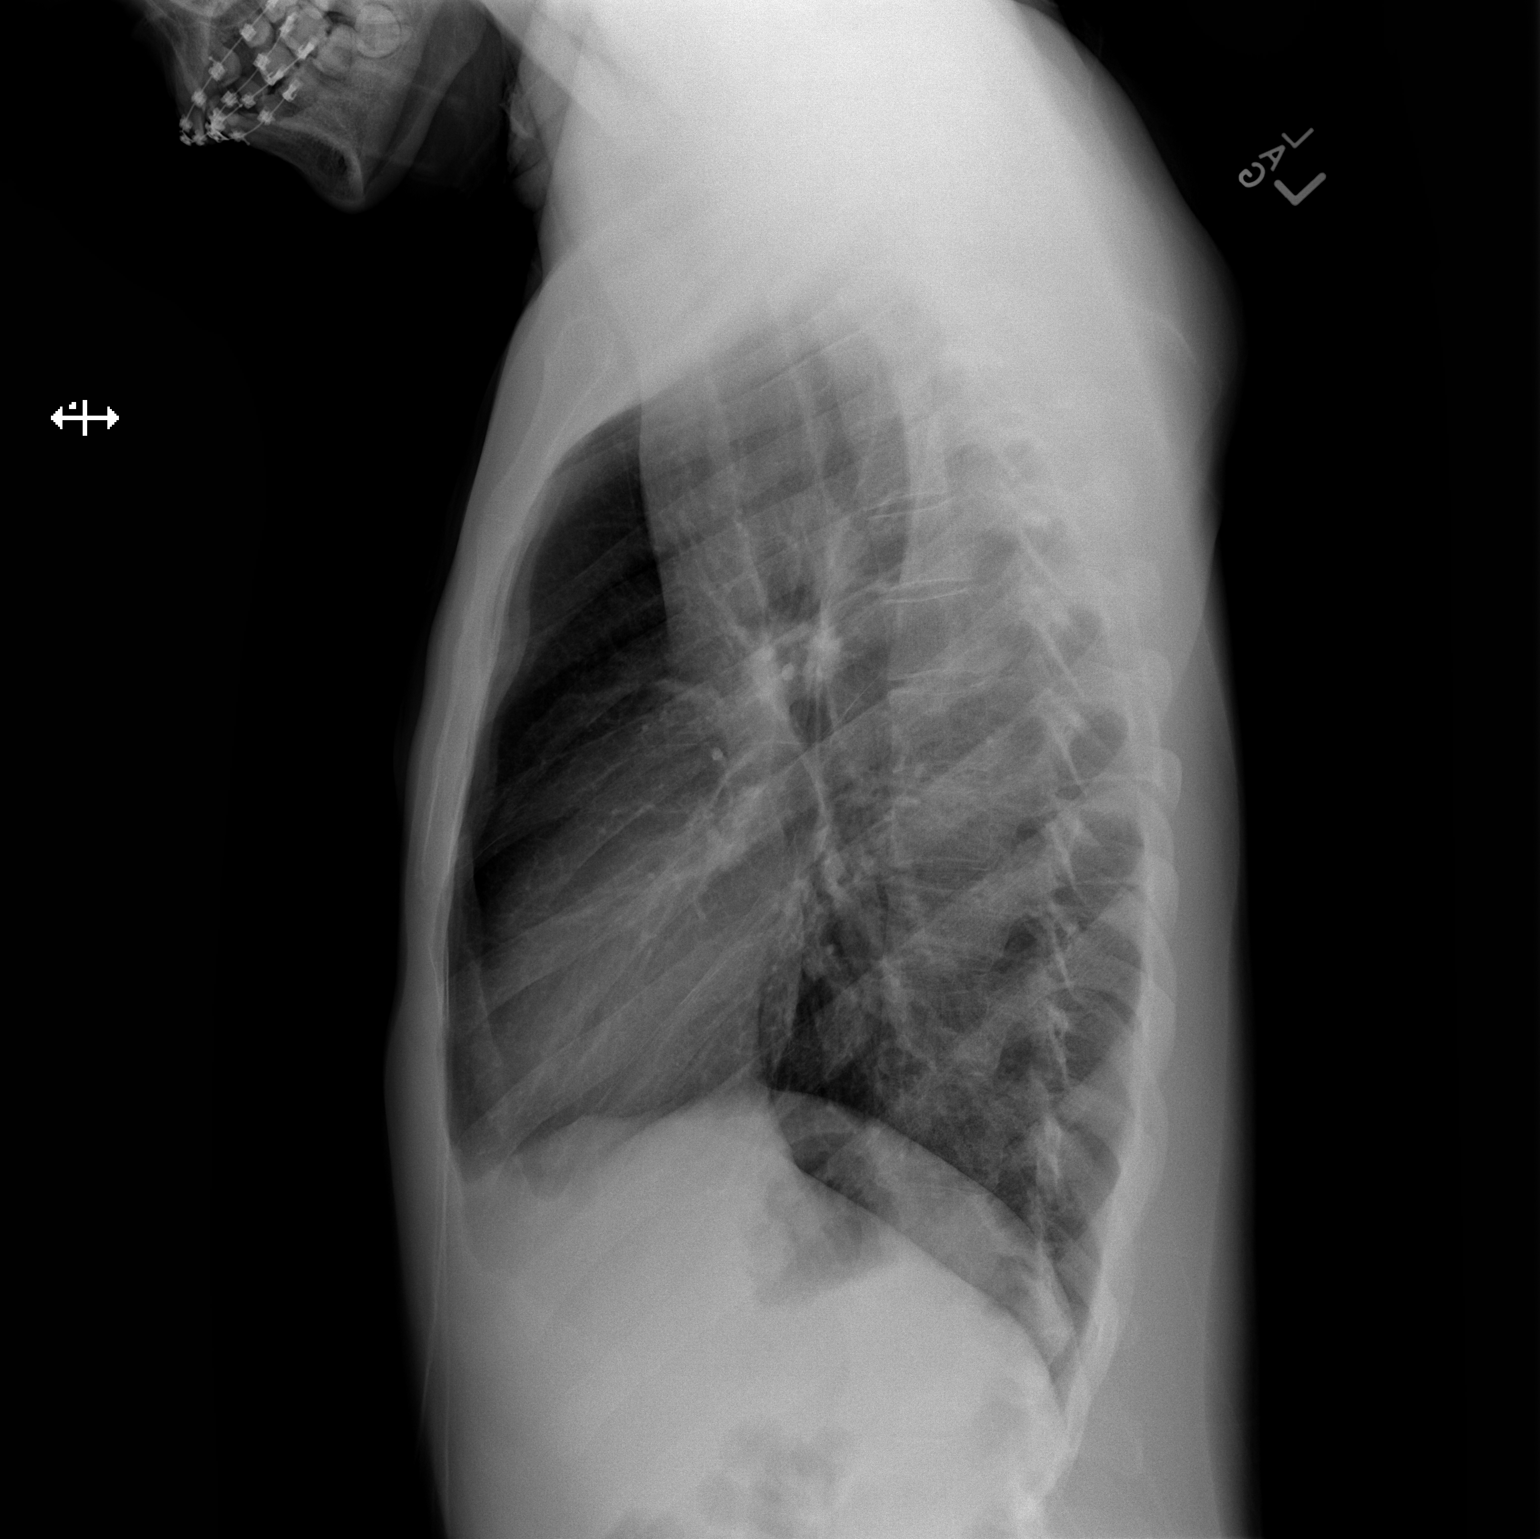

[2 of 2 positions shown; findings below may reference images not displayed]

FINDINGS: The heart size and mediastinal contours are within normal limits.
Both lungs are clear. The visualized skeletal structures are
unremarkable.
IMPRESSION: No active cardiopulmonary disease.

## 2020-02-24 ENCOUNTER — Ambulatory Visit (HOSPITAL_COMMUNITY)
Admission: EM | Admit: 2020-02-24 | Discharge: 2020-02-24 | Disposition: A | Discharge status: Home / Self Care | Payer: HRSA Program | Attending: Family Medicine | Admitting: Family Medicine

## 2020-02-24 ENCOUNTER — Encounter (HOSPITAL_COMMUNITY): Payer: Self-pay | Admitting: Emergency Medicine

## 2020-02-24 ENCOUNTER — Other Ambulatory Visit: Payer: Self-pay

## 2020-02-24 DIAGNOSIS — Z20822 Contact with and (suspected) exposure to covid-19: Secondary | ICD-10-CM | POA: Diagnosis present

## 2020-02-24 DIAGNOSIS — U071 COVID-19: Secondary | ICD-10-CM | POA: Diagnosis not present

## 2020-02-24 DIAGNOSIS — R112 Nausea with vomiting, unspecified: Secondary | ICD-10-CM | POA: Insufficient documentation

## 2020-02-24 DIAGNOSIS — B349 Viral infection, unspecified: Secondary | ICD-10-CM

## 2020-02-24 MED ORDER — ONDANSETRON HCL 4 MG PO TABS: 4.0000 mg | ORAL_TABLET | Freq: Four times a day (QID) | ORAL | 0 refills | Status: DC

## 2020-02-24 MED ORDER — IBUPROFEN 800 MG PO TABS: 800.0000 mg | ORAL_TABLET | Freq: Four times a day (QID) | ORAL | 0 refills | Status: DC | PRN

## 2020-02-24 NOTE — Discharge Instructions (Signed)
Take Zofran for nausea After 30 min may drink fluids Once these stay down start bland diet Take ibuprofen with food, this is for pain Go home to rest Drink plenty of fluids Take Tylenol for pain or fever You may take over-the-counter cough and cold medicines as needed You must quarantine at home until your test result is available You can check for your test result in MyChart

## 2020-02-24 NOTE — ED Provider Notes (Signed)
[facility]    CSN: [id number] Arrival date & time: 02/24/20  1429      History   Chief Complaint Chief Complaint  Patient presents with  . URI    HPI Christian Arias is a 25 y.o. male.   HPI   Patient is here for upper respiratory infection.  He has been having symptoms for 5 to 6 days.  He has had fever and chills, shortness of breath and cough, some abdominal pain and vomiting.  He is taking Tylenol with good relief of the fever.  Is here because symptoms are persisting.  Would like Covid testing.  No known exposure to Covid.  Is not Covid vaccinated   Past Medical History:  Diagnosis Date  . Seizures (HCC)     There are no problems to display for this patient.   History reviewed. No pertinent surgical history.     Home Medications    Prior to Admission medications   Medication Sig Start Date End Date Taking? Authorizing Provider  ondansetron (ZOFRAN) 4 MG tablet Take 1-2 tablets (4-8 mg total) by mouth every 6 (six) hours. As needed nausea 02/24/20  Yes Eustace Moore, MD  acetaminophen (TYLENOL) 500 MG tablet Take 1,000 mg by mouth every 6 (six) hours as needed for mild pain, moderate pain or headache.    [provider]  albuterol (PROVENTIL HFA;VENTOLIN HFA) 108 (90 Base) MCG/ACT inhaler Inhale 2 puffs into the lungs every 4 (four) hours as needed for wheezing or shortness of breath. 10/30/16   Horton, Mayer Masker, MD  ibuprofen (ADVIL) 800 MG tablet Take 1 tablet (800 mg total) by mouth every 6 (six) hours as needed for moderate pain. 02/24/20   Eustace Moore, MD    Family History History reviewed. No pertinent family history.  Social History Social History   Tobacco Use  . Smoking status: Former Smoker    Packs/day: 0.50    Types: Cigarettes  . Smokeless tobacco: Never Used  Vaping Use  . Vaping Use: Never used  Substance Use Topics  . Alcohol use: No  . Drug use: Yes    Types: Marijuana     Allergies   Patient has no  known allergies.   Review of Systems Review of Systems  See HPI Physical Exam Triage Vital Signs ED Triage Vitals  Enc Vitals Group     BP 02/24/20 1639 110/74     Pulse Rate 02/24/20 1639 80     Resp 02/24/20 1639 16     Temp 02/24/20 1639 98.2 F (36.8 C)     Temp Source 02/24/20 1639 Oral     SpO2 02/24/20 1639 98 %     Weight --      Height --      Head Circumference --      Peak Flow --      Pain Score 02/24/20 1640 8     Pain Loc --      Pain Edu? --      Excl. in GC? --    No data found.  Updated Vital Signs BP 110/74 (BP Location: Right Arm)   Pulse 80   Temp 98.2 F (36.8 C) (Oral)   Resp 16   SpO2 98%    Physical Exam Constitutional:      General: He is not in acute distress.    Appearance: He is well-developed and well-nourished.  HENT:     Head: Normocephalic and atraumatic.  Nose: Nose normal. No congestion.     Mouth/Throat:     Mouth: Oropharynx is clear and moist. Mucous membranes are dry.     Pharynx: No posterior oropharyngeal erythema.     Comments: Mask in place Eyes:     Conjunctiva/sclera: Conjunctivae normal.     Pupils: Pupils are equal, round, and reactive to light.  Cardiovascular:     Rate and Rhythm: Normal rate and regular rhythm.     Heart sounds: Normal heart sounds.  Pulmonary:     Effort: Pulmonary effort is normal. No respiratory distress.     Breath sounds: Normal breath sounds.     Comments: Heart and lung exam normal.  Abdomen benign.  No organomegaly mass or tenderness Abdominal:     General: Abdomen is flat. Bowel sounds are normal. There is no distension.     Palpations: Abdomen is soft.     Tenderness: There is no abdominal tenderness.  Musculoskeletal:        General: No edema. Normal range of motion.     Cervical back: Normal range of motion.  Skin:    General: Skin is warm and dry.  Neurological:     General: No focal deficit present.     Mental Status: He is alert.  Psychiatric:        Behavior:  Behavior normal.      UC Treatments / Results  Labs (all labs ordered are listed, but only abnormal results are displayed) Labs Reviewed  SARS CORONAVIRUS 2 (TAT 6-24 HRS)    EKG   Radiology No results found.  Procedures Procedures (including critical care time)  Medications Ordered in UC Medications - No data to display  Initial Impression / Assessment and Plan / UC Course  I have reviewed the triage vital signs and the nursing notes.  Pertinent labs & imaging results that were available during my care of the patient were reviewed by me and considered in my medical decision making (see chart for details).     Importance of quarantine as discussed. Importance of rehydration discussed Final Clinical Impressions(s) / UC Diagnoses   Final diagnoses:  Viral syndrome  Intractable vomiting with nausea, unspecified vomiting type  Encounter for laboratory testing for COVID-19 virus     Discharge Instructions     Take Zofran for nausea After 30 min may drink fluids Once these stay down start bland diet Take ibuprofen with food, this is for pain Go home to rest Drink plenty of fluids Take Tylenol for pain or fever You may take over-the-counter cough and cold medicines as needed You must quarantine at home until your test result is available You can check for your test result in MyChart     ED Prescriptions    Medication Sig Dispense Auth. Provider   ibuprofen (ADVIL) 800 MG tablet  (Status: Discontinued) Take 1 tablet (800 mg total) by mouth every 6 (six) hours as needed for moderate pain. 21 tablet Eustace Moore, MD   ondansetron (ZOFRAN) 4 MG tablet Take 1-2 tablets (4-8 mg total) by mouth every 6 (six) hours. As needed nausea 12 tablet Eustace Moore, MD   ibuprofen (ADVIL) 800 MG tablet Take 1 tablet (800 mg total) by mouth every 6 (six) hours as needed for moderate pain. 21 tablet Eustace Moore, MD     PDMP not reviewed this encounter.    Eustace Moore, MD 02/24/20 [phone]

## 2020-02-24 NOTE — ED Triage Notes (Signed)
Pt c/o cold sx onset 6 days associated w/chills, vomiting, SOB/Dyspnea, fevers, abd pain  Taking OTC acetaminophen w/temp relief.   A&O x4... NAD.Marland Kitchen. ambulatory

## 2020-02-25 LAB — SARS CORONAVIRUS 2 (TAT 6-24 HRS): SARS Coronavirus 2: POSITIVE — AB

## 2020-03-05 ENCOUNTER — Ambulatory Visit (HOSPITAL_COMMUNITY)
Admission: EM | Admit: 2020-03-05 | Discharge: 2020-03-05 | Disposition: A | Discharge status: Home / Self Care | Payer: HRSA Program | Attending: Internal Medicine | Admitting: Internal Medicine

## 2020-03-05 DIAGNOSIS — Z20822 Contact with and (suspected) exposure to covid-19: Secondary | ICD-10-CM | POA: Insufficient documentation

## 2020-03-05 LAB — SARS CORONAVIRUS 2 (TAT 6-24 HRS): SARS Coronavirus 2: NEGATIVE

## 2020-03-05 NOTE — ED Triage Notes (Signed)
Pt states his employer notified him that he was potentially exposed to a COVID positive person and instructed pt have testing.  Pt denies any URI sx, fever, n/v/d.

## 2022-02-24 ENCOUNTER — Ambulatory Visit
Admission: EM | Admit: 2022-02-24 | Discharge: 2022-02-24 | Disposition: A | Discharge status: Home / Self Care | Payer: Self-pay | Attending: Urgent Care | Admitting: Urgent Care

## 2022-02-24 DIAGNOSIS — B349 Viral infection, unspecified: Secondary | ICD-10-CM

## 2022-02-24 DIAGNOSIS — J4520 Mild intermittent asthma, uncomplicated: Secondary | ICD-10-CM

## 2022-02-24 MED ORDER — PREDNISONE 20 MG PO TABS: ORAL_TABLET | ORAL | 0 refills | Status: AC

## 2022-02-24 MED ORDER — ALBUTEROL SULFATE HFA 108 (90 BASE) MCG/ACT IN AERS: 2.0000 | INHALATION_SPRAY | RESPIRATORY_TRACT | 0 refills | Status: AC | PRN

## 2022-02-24 MED ORDER — CETIRIZINE HCL 10 MG PO TABS: 10.0000 mg | ORAL_TABLET | Freq: Every day | ORAL | 0 refills | Status: AC

## 2022-02-24 MED ORDER — PROMETHAZINE-DM 6.25-15 MG/5ML PO SYRP: 5.0000 mL | ORAL_SOLUTION | Freq: Three times a day (TID) | ORAL | 0 refills | Status: AC | PRN

## 2022-02-24 NOTE — ED Triage Notes (Signed)
Pt c/o cough, URI sx x 5 days-vomited x 2 today-NAD-steady gait

## 2022-02-24 NOTE — ED Provider Notes (Signed)
[facility]  Note:  This document was prepared using Systems analyst and may include unintentional dictation errors.  MRN: [id number] DOB: 1995/07/28  Subjective:   Christian Arias is a 27 y.o. male presenting for 5 day history of acute onset coughing, runny and stuffy nose, body aches, throat pain. Had vomiting episode this morning. No fever, chest pain, shob, wheezing. Has a history of asthma, does not have an active inhaler. Smokes 1 pack of cigars daily (#2).  Has tried multiple otc medications.   No chronic medications.    No Known Allergies  Past Medical History:  Diagnosis Date   Seizures ([place])      History reviewed. No pertinent surgical history.  History reviewed. No pertinent family history.  Social History   Tobacco Use   Smoking status: Every Day    Types: Cigars   Smokeless tobacco: Never  Vaping Use   Vaping Use: Never used  Substance Use Topics   Alcohol use: Yes    Comment: occ   Drug use: Not Currently    ROS   Objective:   Vitals: BP 113/68 (BP Location: Right Arm)   Pulse 69   Temp 98.1 F (36.7 C) (Oral)   Resp 16   SpO2 95%   Physical Exam Constitutional:      General: He is not in acute distress.    Appearance: Normal appearance. He is well-developed and normal weight. He is not ill-appearing, toxic-appearing or diaphoretic.  HENT:     Head: Normocephalic and atraumatic.     Right Ear: Tympanic membrane, ear canal and external ear normal. No drainage, swelling or tenderness. No middle ear effusion. There is no impacted cerumen. Tympanic membrane is not erythematous or bulging.     Left Ear: Tympanic membrane, ear canal and external ear normal. No drainage, swelling or tenderness.  No middle ear effusion. There is no impacted cerumen. Tympanic membrane is not erythematous or bulging.     Nose: Congestion present. No rhinorrhea.     Mouth/Throat:     Mouth: Mucous membranes are moist.      Pharynx: Posterior oropharyngeal erythema (with thick postnasal drainage overlying pharynx) present. No oropharyngeal exudate.  Eyes:     General: No scleral icterus.       Right eye: No discharge.        Left eye: No discharge.     Extraocular Movements: Extraocular movements intact.     Conjunctiva/sclera: Conjunctivae normal.  Cardiovascular:     Rate and Rhythm: Normal rate and regular rhythm.     Heart sounds: Normal heart sounds. No murmur heard.    No friction rub. No gallop.  Pulmonary:     Effort: Pulmonary effort is normal. No respiratory distress.     Breath sounds: Normal breath sounds. No stridor. No wheezing, rhonchi or rales.  Musculoskeletal:     Cervical back: Normal range of motion and neck supple. No rigidity. No muscular tenderness.  Neurological:     General: No focal deficit present.     Mental Status: He is alert and oriented to person, place, and time.  Psychiatric:        Mood and Affect: Mood normal.        Behavior: Behavior normal.        Thought Content: Thought content normal.     Assessment and Plan :   PDMP not reviewed this encounter.  1. Acute viral syndrome   2. Mild intermittent asthma  without complication     Deferred imaging given clear cardiopulmonary exam, hemodynamically stable vital signs.  Recommended oral prednisone course and albuterol given his smoking, asthma.  Otherwise, suspect viral URI, viral syndrome. Physical exam findings reassuring and vital signs stable for discharge. Advised supportive care, offered symptomatic relief. Counseled patient on potential for adverse effects with medications prescribed/recommended today, ER and return-to-clinic precautions discussed, patient verbalized understanding.     Jaynee Eagles, PA-C 02/24/22 1128

## 2023-11-06 ENCOUNTER — Ambulatory Visit
Admission: EM | Admit: 2023-11-06 | Discharge: 2023-11-06 | Disposition: A | Discharge status: Home / Self Care | Payer: Self-pay | Attending: Family Medicine | Admitting: Family Medicine

## 2023-11-06 DIAGNOSIS — B349 Viral infection, unspecified: Secondary | ICD-10-CM | POA: Insufficient documentation

## 2023-11-06 DIAGNOSIS — J029 Acute pharyngitis, unspecified: Secondary | ICD-10-CM | POA: Insufficient documentation

## 2023-11-06 DIAGNOSIS — R110 Nausea: Secondary | ICD-10-CM | POA: Insufficient documentation

## 2023-11-06 LAB — POCT RAPID STREP A (OFFICE): Rapid Strep A Screen: NEGATIVE

## 2023-11-06 MED ORDER — ONDANSETRON 4 MG PO TBDP
4.0000 mg | ORAL_TABLET | Freq: Once | ORAL | Status: AC
Start: 2023-11-06 — End: 2023-11-06
  Administered 2023-11-06: 4 mg via ORAL

## 2023-11-06 MED ORDER — ONDANSETRON 4 MG PO TBDP
4.0000 mg | ORAL_TABLET | Freq: Three times a day (TID) | ORAL | 0 refills | Status: AC | PRN
Start: 2023-11-06 — End: ?

## 2023-11-06 NOTE — ED Provider Notes (Signed)
 [facility]    CSN: [id number] Arrival date & time: 11/06/23  0948      History   Chief Complaint Chief Complaint  Patient presents with   Diarrhea   Sore Throat    HPI Christian Arias is a 28 y.o. male  presents for evaluation of URI symptoms for couple hours. Patient reports associated symptoms of sore throat, nausea, diarrhea, fatigue, body aches. Denies vomiting, fevers, cough, congestion, ear pain, shortness of breath patient does not have a hx of asthma. Patient is an active smoker.   Reports sick contacts via work.  Pt has taken Alka-Seltzer OTC for symptoms. Pt has no other concerns at this time.    Diarrhea Associated symptoms: myalgias   Sore Throat    Past Medical History:  Diagnosis Date   Seizures (HCC)     There are no active problems to display for this patient.   History reviewed. No pertinent surgical history.     Home Medications    Prior to Admission medications   Medication Sig Start Date End Date Taking? Authorizing Provider  ondansetron  (ZOFRAN -ODT) 4 MG disintegrating tablet Take 1 tablet (4 mg total) by mouth every 8 (eight) hours as needed for nausea or vomiting. 11/06/23  Yes Gabreal Worton, Jodi R, NP  albuterol  (VENTOLIN  HFA) 108 (90 Base) MCG/ACT inhaler Inhale 2 puffs into the lungs every 4 (four) hours as needed for wheezing or shortness of breath. 02/24/22   Christopher Savannah, PA-C  cetirizine  (ZYRTEC  ALLERGY) 10 MG tablet Take 1 tablet (10 mg total) by mouth daily. 02/24/22   Christopher Savannah, PA-C  predniSONE  (DELTASONE ) 20 MG tablet Take 2 tablets daily with breakfast. 02/24/22   Christopher Savannah, PA-C  promethazine -dextromethorphan (PROMETHAZINE -DM) 6.25-15 MG/5ML syrup Take 5 mLs by mouth 3 (three) times daily as needed for cough. 02/24/22   Christopher Savannah, PA-C    Family History History reviewed. No pertinent family history.  Social History Social History   Tobacco Use   Smoking status: Every Day    Types: Cigars   Smokeless tobacco: Never   Vaping Use   Vaping status: Never Used  Substance Use Topics   Alcohol use: Yes    Comment: occ   Drug use: Not Currently     Allergies   Patient has no known allergies.   Review of Systems Review of Systems  Constitutional:  Positive for fatigue.  HENT:  Positive for sore throat.   Gastrointestinal:  Positive for diarrhea and nausea.  Musculoskeletal:  Positive for myalgias.     Physical Exam Triage Vital Signs ED Triage Vitals  Encounter Vitals Group     BP 11/06/23 0956 101/68     Girls Systolic BP Percentile --      Girls Diastolic BP Percentile --      Boys Systolic BP Percentile --      Boys Diastolic BP Percentile --      Pulse Rate 11/06/23 0956 80     Resp 11/06/23 0956 19     Temp 11/06/23 0956 98.5 F (36.9 C)     Temp Source 11/06/23 0956 Oral     SpO2 11/06/23 0956 98 %     Weight --      Height --      Head Circumference --      Peak Flow --      Pain Score 11/06/23 0955 4     Pain Loc --      Pain Education --  Exclude from Growth Chart --    No data found.  Updated Vital Signs BP 101/68 (BP Location: Right Arm)   Pulse 80   Temp 98.5 F (36.9 C) (Oral)   Resp 19   SpO2 98%   Visual Acuity Right Eye Distance:   Left Eye Distance:   Bilateral Distance:    Right Eye Near:   Left Eye Near:    Bilateral Near:     Physical Exam Vitals and nursing note reviewed.  Constitutional:      General: He is not in acute distress.    Appearance: Normal appearance. He is not ill-appearing or toxic-appearing.  HENT:     Head: Normocephalic and atraumatic.     Right Ear: Tympanic membrane and ear canal normal.     Left Ear: Tympanic membrane and ear canal normal.     Nose: No congestion or rhinorrhea.     Mouth/Throat:     Mouth: Mucous membranes are moist.     Pharynx: Posterior oropharyngeal erythema present.  Eyes:     Pupils: Pupils are equal, round, and reactive to light.  Cardiovascular:     Rate and Rhythm: Normal rate and  regular rhythm.     Heart sounds: Normal heart sounds.  Pulmonary:     Effort: Pulmonary effort is normal.     Breath sounds: Normal breath sounds.  Musculoskeletal:     Cervical back: Normal range of motion and neck supple.  Lymphadenopathy:     Cervical: No cervical adenopathy.  Skin:    General: Skin is warm and dry.  Neurological:     General: No focal deficit present.     Mental Status: He is alert and oriented to person, place, and time.  Psychiatric:        Mood and Affect: Mood normal.        Behavior: Behavior normal.      UC Treatments / Results  Labs (all labs ordered are listed, but only abnormal results are displayed) Labs Reviewed  CULTURE, GROUP A STREP [facility])  POCT RAPID STREP A (OFFICE)    EKG   Radiology No results found.  Procedures Procedures (including critical care time)  Medications Ordered in UC Medications  ondansetron  (ZOFRAN -ODT) disintegrating tablet 4 mg (4 mg Oral Given 11/06/23 1037)    Initial Impression / Assessment and Plan / UC Course  I have reviewed the triage vital signs and the nursing notes.  Pertinent labs & imaging results that were available during my care of the patient were reviewed by me and considered in my medical decision making (see chart for details).     Reviewed exam and symptoms with patient.  No red flags.  Negative rapid strep and strep throat culture.  Discussed that symptoms began only a couple hours ago too early to test for COVID or flu.  Advise he should wait 24 to 48 hours for testing if symptoms persist.  Discussed likely viral illness and symptomatic treatment.  Rx Zofran  sent to pharmacy.  May use OTC Imodium if needed.  Encouraged rest fluids and PCP follow-up if symptoms do not improve.  ER precautions reviewed for Final Clinical Impressions(s) / UC Diagnoses   Final diagnoses:  Sore throat  Nausea  Viral illness     Discharge Instructions      You tested negative for strep throat.  Please  treat your symptoms with over the counter cough medication, tylenol  or ibuprofen , humidifier, and rest.  You may take Zofran  every 8  hours as needed for your nausea or vomiting.  You were given a dose while in the clinic.  Do not take another dose until 6:30 PM today.  Viral illnesses can last 7-14 days. Please follow up with your PCP if your symptoms are not improving. Please go to the ER for any worsening symptoms. This includes but is not limited to fever you can not control with tylenol  or ibuprofen , you are not able to stay hydrated, you have shortness of breath or chest pain.  Thank you for choosing [facility] for your healthcare needs. I hope you feel better soon!      ED Prescriptions     Medication Sig Dispense Auth. Provider   ondansetron  (ZOFRAN -ODT) 4 MG disintegrating tablet Take 1 tablet (4 mg total) by mouth every 8 (eight) hours as needed for nausea or vomiting. 8 tablet Lamari Beckles, Jodi R, NP      PDMP not reviewed this encounter.   Loreda Myla SAUNDERS, NP 11/06/23 1050

## 2023-11-06 NOTE — ED Triage Notes (Signed)
 Pt present with c/o body aches, diarrhea and fatigue x this morning. Pt states he had [employer] breakfast and orange juice before work. States he noticed his throat was sore and hurting. 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

## 2023-11-06 NOTE — Discharge Instructions (Signed)
 You tested negative for strep throat.  Please treat your symptoms with over the counter cough medication, tylenol  or ibuprofen , humidifier, and rest.  You may take Zofran  every 8 hours as needed for your nausea or vomiting.  You were given a dose while in the clinic.  Do not take another dose until 6:30 PM today.  Viral illnesses can last 7-14 days. Please follow up with your PCP if your symptoms are not improving. Please go to the ER for any worsening symptoms. This includes but is not limited to fever you can not control with tylenol  or ibuprofen , you are not able to stay hydrated, you have shortness of breath or chest pain.  Thank you for choosing [facility] for your healthcare needs. I hope you feel better soon!

## 2023-11-09 ENCOUNTER — Ambulatory Visit: Payer: Self-pay

## 2023-11-09 LAB — CULTURE, GROUP A STREP (THRC)

## 2023-11-11 ENCOUNTER — Ambulatory Visit
Admission: EM | Admit: 2023-11-11 | Discharge: 2023-11-11 | Disposition: A | Discharge status: Home / Self Care | Payer: Self-pay | Attending: Family Medicine | Admitting: Family Medicine

## 2023-11-11 ENCOUNTER — Other Ambulatory Visit: Payer: Self-pay

## 2023-11-11 VITALS — BP 128/73 | HR 64 | Temp 98.6°F | Resp 16

## 2023-11-11 DIAGNOSIS — R221 Localized swelling, mass and lump, neck: Secondary | ICD-10-CM

## 2023-11-11 MED ORDER — DOXYCYCLINE HYCLATE 100 MG PO CAPS: 100.0000 mg | ORAL_CAPSULE | Freq: Two times a day (BID) | ORAL | 0 refills | Status: AC

## 2023-11-11 NOTE — Discharge Instructions (Signed)
 Start doxycycline  twice daily for 10 days.  Avoid shaving area until treatment is complete.  Please follow-up with your PCP or schedule appointment for recheck/further evaluation and workup if your symptoms do not improve on antibiotics.  Please go to the ER for any worsening symptoms that occur prior to seeing your PCP.  Hope you feel better soon!

## 2023-11-11 NOTE — ED Provider Notes (Signed)
 [facility]    CSN: [id number] Arrival date & time: 11/11/23  1021      History   Chief Complaint Chief Complaint  Patient presents with   Abscess    HPI Christian Arias is a 28 y.o. male presents for possible abscess.  Patient reports 1-1/2 weeks of a nodule/bump to his right neck just under the jawline.  States it is tender but denies any drainage, fevers or chills.  No injury.  No tooth pain.  No difficulty swallowing.  No injury.  He does wear a respirator at work that rubs the area frequently and has become more uncomfortable since he has noticed it.  No history of MRSA.  He is concerned for possible malignancy as he states he works at a [employer] where he is exposed to a lot of carcinogens even though he does wear a respirator.  He has not used any OTC treatments for symptoms.  No other concerns at this time   Abscess   Past Medical History:  Diagnosis Date   Seizures (HCC)     There are no active problems to display for this patient.   History reviewed. No pertinent surgical history.     Home Medications    Prior to Admission medications   Medication Sig Start Date End Date Taking? Authorizing Provider  doxycycline  (VIBRAMYCIN ) 100 MG capsule Take 1 capsule (100 mg total) by mouth 2 (two) times daily. 11/11/23  Yes Kizzy Olafson, Jodi R, NP  albuterol  (VENTOLIN  HFA) 108 (90 Base) MCG/ACT inhaler Inhale 2 puffs into the lungs every 4 (four) hours as needed for wheezing or shortness of breath. 02/24/22   Christopher Savannah, PA-C  cetirizine  (ZYRTEC  ALLERGY) 10 MG tablet Take 1 tablet (10 mg total) by mouth daily. 02/24/22   Christopher Savannah, PA-C  ondansetron  (ZOFRAN -ODT) 4 MG disintegrating tablet Take 1 tablet (4 mg total) by mouth every 8 (eight) hours as needed for nausea or vomiting. 11/06/23   Alexea Blase, Jodi R, NP  predniSONE  (DELTASONE ) 20 MG tablet Take 2 tablets daily with breakfast. 02/24/22   Christopher Savannah, PA-C  promethazine -dextromethorphan (PROMETHAZINE -DM) 6.25-15  MG/5ML syrup Take 5 mLs by mouth 3 (three) times daily as needed for cough. 02/24/22   Christopher Savannah, PA-C    Family History History reviewed. No pertinent family history.  Social History Social History   Tobacco Use   Smoking status: Every Day    Types: Cigars   Smokeless tobacco: Never  Vaping Use   Vaping status: Never Used  Substance Use Topics   Alcohol use: Yes    Comment: occ   Drug use: Not Currently     Allergies   Patient has no known allergies.   Review of Systems Review of Systems  Skin:        Nodules/bump on right side of neck underneath jawline     Physical Exam Triage Vital Signs ED Triage Vitals  Encounter Vitals Group     BP 11/11/23 1033 128/73     Girls Systolic BP Percentile --      Girls Diastolic BP Percentile --      Boys Systolic BP Percentile --      Boys Diastolic BP Percentile --      Pulse Rate 11/11/23 1033 64     Resp 11/11/23 1033 16     Temp 11/11/23 1033 98.6 F (37 C)     Temp Source 11/11/23 1033 Oral     SpO2 11/11/23 1033 98 %  Weight --      Height --      Head Circumference --      Peak Flow --      Pain Score 11/11/23 1031 0     Pain Loc --      Pain Education --      Exclude from Growth Chart --    No data found.  Updated Vital Signs BP 128/73   Pulse 64   Temp 98.6 F (37 C) (Oral)   Resp 16   SpO2 98%   Visual Acuity Right Eye Distance:   Left Eye Distance:   Bilateral Distance:    Right Eye Near:   Left Eye Near:    Bilateral Near:     Physical Exam Vitals and nursing note reviewed.  Constitutional:      General: He is not in acute distress.    Appearance: Normal appearance. He is not ill-appearing.  HENT:     Head: Normocephalic and atraumatic.     Jaw: No trismus.      Comments: There is a firm fixed nodule to the mid right sided neck adjacent to the jawline.  There is no induration or fluctuance.  No erythema or warmth.  Area is mildly tender to palpation. Eyes:     Pupils: Pupils are  equal, round, and reactive to light.  Cardiovascular:     Rate and Rhythm: Normal rate.  Pulmonary:     Effort: Pulmonary effort is normal.  Skin:    General: Skin is warm and dry.  Neurological:     General: No focal deficit present.     Mental Status: He is alert and oriented to person, place, and time.  Psychiatric:        Mood and Affect: Mood normal.        Behavior: Behavior normal.      UC Treatments / Results  Labs (all labs ordered are listed, but only abnormal results are displayed) Labs Reviewed - No data to display  EKG   Radiology No results found.  Procedures Procedures (including critical care time)  Medications Ordered in UC Medications - No data to display  Initial Impression / Assessment and Plan / UC Course  I have reviewed the triage vital signs and the nursing notes.  Pertinent labs & imaging results that were available during my care of the patient were reviewed by me and considered in my medical decision making (see chart for details).     Reviewed exam and symptoms with patient.  Unclear if this is infectious versus benign growth/cyst versus malignancy.  Will treat with doxycycline  and nursing staff was able to get him a PCP appointment where he will follow-up.  If it does not improve discussed he may need imaging but this would be done through his PCP.  Also advised that he should go to the ER for any worsening symptoms prior to seeing his PCP, red flags reviewed and patient verbalized understanding. Final Clinical Impressions(s) / UC Diagnoses   Final diagnoses:  Nodule of neck     Discharge Instructions      Start doxycycline  twice daily for 10 days.  Avoid shaving area until treatment is complete.  Please follow-up with your PCP or schedule appointment for recheck/further evaluation and workup if your symptoms do not improve on antibiotics.  Please go to the ER for any worsening symptoms that occur prior to seeing your PCP.  Hope you  feel better soon!     ED  Prescriptions     Medication Sig Dispense Auth. Provider   doxycycline  (VIBRAMYCIN ) 100 MG capsule Take 1 capsule (100 mg total) by mouth 2 (two) times daily. 20 capsule Tierany Appleby, Jodi R, NP      PDMP not reviewed this encounter.   Loreda Myla SAUNDERS, NP 11/11/23 1100

## 2023-11-11 NOTE — ED Triage Notes (Signed)
 Pt c/o has an abscess on right side of jawx1.5wks. The abscess is approx the size of a quarter. The abscess is intact

## 2023-11-29 ENCOUNTER — Emergency Department (HOSPITAL_BASED_OUTPATIENT_CLINIC_OR_DEPARTMENT_OTHER)
Admission: EM | Admit: 2023-11-29 | Discharge: 2023-11-29 | Disposition: A | Discharge status: Home / Self Care | Payer: Self-pay | Source: Ambulatory Visit | Attending: Emergency Medicine | Admitting: Emergency Medicine

## 2023-11-29 ENCOUNTER — Other Ambulatory Visit: Payer: Self-pay

## 2023-11-29 ENCOUNTER — Ambulatory Visit
Admission: EM | Admit: 2023-11-29 | Discharge: 2023-11-29 | Disposition: A | Discharge status: Other Acute Inpatient Hospital | Payer: Self-pay | Attending: Family Medicine | Admitting: Family Medicine

## 2023-11-29 ENCOUNTER — Emergency Department (HOSPITAL_BASED_OUTPATIENT_CLINIC_OR_DEPARTMENT_OTHER): Payer: Self-pay

## 2023-11-29 ENCOUNTER — Encounter (HOSPITAL_BASED_OUTPATIENT_CLINIC_OR_DEPARTMENT_OTHER): Payer: Self-pay

## 2023-11-29 DIAGNOSIS — R42 Dizziness and giddiness: Secondary | ICD-10-CM

## 2023-11-29 DIAGNOSIS — R519 Headache, unspecified: Secondary | ICD-10-CM | POA: Insufficient documentation

## 2023-11-29 LAB — CBC WITH DIFFERENTIAL/PLATELET
Abs Immature Granulocytes: 0.01 K/uL (ref 0.00–0.07)
Basophils Absolute: 0 K/uL (ref 0.0–0.1)
Basophils Relative: 1 %
Eosinophils Absolute: 0.1 K/uL (ref 0.0–0.5)
Eosinophils Relative: 2 %
HCT: 41.9 % (ref 39.0–52.0)
Hemoglobin: 14.6 g/dL (ref 13.0–17.0)
Immature Granulocytes: 0 %
Lymphocytes Relative: 45 %
Lymphs Abs: 1.6 K/uL (ref 0.7–4.0)
MCH: 31.7 pg (ref 26.0–34.0)
MCHC: 34.8 g/dL (ref 30.0–36.0)
MCV: 90.9 fL (ref 80.0–100.0)
Monocytes Absolute: 0.3 K/uL (ref 0.1–1.0)
Monocytes Relative: 8 %
Neutro Abs: 1.6 K/uL — ABNORMAL LOW (ref 1.7–7.7)
Neutrophils Relative %: 44 %
Platelets: 236 K/uL (ref 150–400)
RBC: 4.61 MIL/uL (ref 4.22–5.81)
RDW: 14.1 % (ref 11.5–15.5)
WBC: 3.5 K/uL — ABNORMAL LOW (ref 4.0–10.5)
nRBC: 0 % (ref 0.0–0.2)

## 2023-11-29 LAB — COMPREHENSIVE METABOLIC PANEL WITH GFR
ALT: 19 U/L (ref 0–44)
AST: 27 U/L (ref 15–41)
Albumin: 5 g/dL (ref 3.5–5.0)
Alkaline Phosphatase: 51 U/L (ref 38–126)
Anion gap: 12 (ref 5–15)
BUN: 16 mg/dL (ref 6–20)
CO2: 25 mmol/L (ref 22–32)
Calcium: 10.1 mg/dL (ref 8.9–10.3)
Chloride: 101 mmol/L (ref 98–111)
Creatinine, Ser: 1 mg/dL (ref 0.61–1.24)
GFR, Estimated: >60 mL/min (ref >60)
Glucose, Bld: 85 mg/dL (ref 70–99)
Potassium: 3.8 mmol/L (ref 3.5–5.1)
Sodium: 138 mmol/L (ref 135–145)
Total Bilirubin: 1.1 mg/dL (ref 0.0–1.2)
Total Protein: 8 g/dL (ref 6.5–8.1)

## 2023-11-29 MED ORDER — ONDANSETRON HCL 4 MG/2ML IJ SOLN
4.0000 mg | Freq: Once | INTRAMUSCULAR | Status: AC
  Administered 2023-11-29: 4 mg via INTRAVENOUS
  Filled 2023-11-29: qty 2

## 2023-11-29 MED ORDER — DIPHENHYDRAMINE HCL 50 MG/ML IJ SOLN
12.5000 mg | Freq: Once | INTRAMUSCULAR | Status: DC
  Filled 2023-11-29: qty 1

## 2023-11-29 MED ORDER — PROCHLORPERAZINE EDISYLATE 10 MG/2ML IJ SOLN
10.0000 mg | Freq: Once | INTRAMUSCULAR | Status: DC
  Filled 2023-11-29: qty 2

## 2023-11-29 MED ORDER — ACETAMINOPHEN 500 MG PO TABS
1000.0000 mg | ORAL_TABLET | Freq: Once | ORAL | Status: AC
  Administered 2023-11-29: 1000 mg via ORAL
  Filled 2023-11-29: qty 2

## 2023-11-29 MED ORDER — KETOROLAC TROMETHAMINE 15 MG/ML IJ SOLN
15.0000 mg | Freq: Once | INTRAMUSCULAR | Status: AC
  Administered 2023-11-29: 15 mg via INTRAVENOUS
  Filled 2023-11-29: qty 1

## 2023-11-29 MED ORDER — SODIUM CHLORIDE 0.9 % IV BOLUS
1000.0000 mL | Freq: Once | INTRAVENOUS | Status: AC
  Administered 2023-11-29: 1000 mL via INTRAVENOUS

## 2023-11-29 MED ORDER — DEXAMETHASONE SODIUM PHOSPHATE 10 MG/ML IJ SOLN
10.0000 mg | Freq: Once | INTRAMUSCULAR | Status: AC
  Administered 2023-11-29: 10 mg via INTRAVENOUS

## 2023-11-29 MED ORDER — DEXAMETHASONE SODIUM PHOSPHATE 10 MG/ML IJ SOLN
10.0000 mg | Freq: Once | INTRAMUSCULAR | Status: DC
Start: 2023-11-29 — End: 2023-11-29

## 2023-11-29 NOTE — ED Triage Notes (Signed)
 Arrives ambulatory to the ED with complaints of headache, dizziness, and having a near syncopal episode while at work. Patient was sent here by Urgent Care for further evaluation.

## 2023-11-29 NOTE — Discharge Instructions (Signed)
 Please go to the ER for further evaluation of your symptoms

## 2023-11-29 NOTE — ED Notes (Signed)
 Patient is being discharged from the Urgent Care and sent to the Emergency Department via POV . Per Myla Bold, NP, patient is in need of higher level of care due to needing further testing and work up. Patient is aware and verbalizes understanding of plan of care.  Vitals:   11/29/23 0850  BP: 102/67  Pulse: 71  Resp: 16  Temp: 98 F (36.7 C)  SpO2: 98%

## 2023-11-29 NOTE — Discharge Instructions (Signed)
 Please return if symptoms worsen.  Continue Tylenol  and ibuprofen  for pain at home.  1000 mg of Tylenol  every 6 hours as needed.  400 mg ibuprofen  every 8 hours as needed.  Follow-up with your primary care doctor.

## 2023-11-29 NOTE — ED Triage Notes (Addendum)
 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 Pt states had int HA upon waking this morning at 0350. Pt states dizziness started at 0655 today. Pt LKW [id number]. Pt c/o tingling in posterior of right footx1wk.

## 2023-11-29 NOTE — ED Provider Notes (Signed)
 [facility] Provider Note   CSN: [id number] Arrival date & time: 11/29/23  9051     Patient presents with: Headache and Dizziness   Christian Arias is a 28 y.o. male.   Patient here with headache dizziness.  Started a couple hours ago.  Fairly mild headache but got a little bit worse when he got to work with some lightheadedness and dizzy feeling.  History of seizures.  Denies any weakness numbness tingling.  Denies any cough sputum production.  No neck pain.  Is feeling better now.  He may be having some ringing in his ears.  No vertigo history.  Denies any chest pain.  Denies any cocaine use.  Does drink occasionally.  No sick contacts.  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  The history is provided by the patient.       Prior to Admission medications   Medication Sig Start Date End Date Taking? Authorizing Provider  albuterol  (VENTOLIN  HFA) 108 (90 Base) MCG/ACT inhaler Inhale 2 puffs into the lungs every 4 (four) hours as needed for wheezing or shortness of breath. 02/24/22   Christopher Savannah, PA-C  cetirizine  (ZYRTEC  ALLERGY) 10 MG tablet Take 1 tablet (10 mg total) by mouth daily. 02/24/22   Christopher Savannah, PA-C  doxycycline  (VIBRAMYCIN ) 100 MG capsule Take 1 capsule (100 mg total) by mouth 2 (two) times daily. 11/11/23   Mayer, Jodi R, NP  ondansetron  (ZOFRAN -ODT) 4 MG disintegrating tablet Take 1 tablet (4 mg total) by mouth every 8 (eight) hours as needed for nausea or vomiting. 11/06/23   Mayer, Jodi R, NP  predniSONE  (DELTASONE ) 20 MG tablet Take 2 tablets daily with breakfast. 02/24/22   Christopher Savannah, PA-C  promethazine -dextromethorphan (PROMETHAZINE -DM) 6.25-15 MG/5ML syrup Take 5 mLs by mouth 3 (three) times daily as needed for cough. 02/24/22   Christopher Savannah, PA-C    Allergies: Patient has no known allergies.     Review of Systems  Updated Vital Signs BP 111/74   Pulse 60   Temp 97.8 F (36.6 C)   Resp 18   Ht 5' 8 (1.727 m)   Wt 64.9 kg   SpO2 100%   BMI 21.74 kg/m   Physical Exam Vitals and nursing note reviewed.  Constitutional:      General: He is not in acute distress.    Appearance: He is well-developed. He is not ill-appearing.  HENT:     Head: Normocephalic and atraumatic.     Mouth/Throat:     Mouth: Mucous membranes are moist.  Eyes:     General: No visual field deficit.    Extraocular Movements: Extraocular movements intact.     Right eye: Normal extraocular motion and no nystagmus.     Left eye: Normal extraocular motion and no nystagmus.     Conjunctiva/sclera: Conjunctivae normal.     Pupils: Pupils are equal, round, and reactive to light.  Neck:     Meningeal: Brudzinski's sign and Kernig's sign absent.  Cardiovascular:     Rate and Rhythm: Normal rate and regular rhythm.     Heart sounds: No murmur heard. Pulmonary:     Effort: Pulmonary effort is normal. No respiratory distress.     Breath sounds: Normal breath sounds.  Abdominal:     Palpations: Abdomen is soft.     Tenderness:  There is no abdominal tenderness.  Musculoskeletal:        General: No swelling.     Cervical back: Normal range of motion and neck supple. No rigidity.  Lymphadenopathy:     Cervical: No cervical adenopathy.  Skin:    General: Skin is warm and dry.     Capillary Refill: Capillary refill takes less than 2 seconds.  Neurological:     Mental Status: He is alert and oriented to person, place, and time.     Cranial Nerves: No cranial nerve deficit, dysarthria or facial asymmetry.     Sensory: No sensory deficit.     Motor: No weakness.     Comments: 5+ out of 5 strength throughout, normal sensation, no drift, normal finger-to-nose finger, normal speech normal coordination normal visual fields no nystagmus  Psychiatric:        Mood and Affect: Mood normal.     (all labs  ordered are listed, but only abnormal results are displayed) Labs Reviewed  CBC WITH DIFFERENTIAL/PLATELET - Abnormal; Notable for the following components:      Result Value   WBC 3.5 (*)    Neutro Abs 1.6 (*)    All other components within normal limits  COMPREHENSIVE METABOLIC PANEL WITH GFR  CBC WITH DIFFERENTIAL/PLATELET    EKG: EKG Interpretation Date/Time:  Thursday November 29 2023 10:08:32 EDT Ventricular Rate:  59 PR Interval:  152 QRS Duration:  95 QT Interval:  413 QTC Calculation: 410 R Axis:   82  Text Interpretation: Sinus rhythm Probable left ventricular hypertrophy Confirmed by Ruthe Cornet [phone]) on 11/29/2023 10:32:42 AM  Radiology: CT Head Wo Contrast Result Date: 11/29/2023 EXAM: CT HEAD WITHOUT CONTRAST 11/29/2023 11:32:58 AM TECHNIQUE: CT of the head was performed without the administration of intravenous contrast. Automated exposure control, iterative reconstruction, and/or weight based adjustment of the mA/kV was utilized to reduce the radiation dose to as low as reasonably achievable. COMPARISON: Report of previous brain MRI 12/10/2001 (no images available). CLINICAL HISTORY: 28 year old male with headache, dizziness, and near syncopal episode. FINDINGS: BRAIN AND VENTRICLES: No acute hemorrhage. No evidence of acute infarct. No hydrocephalus. No extra-axial collection. No mass effect or midline shift. Bilateral basal ganglia vascular calcifications. Normal cerebral volume. No suspicious intracranial vascular hyperdensity. ORBITS: No acute abnormality. SINUSES: Mild to moderate left maxillary sinus mucosal thickening. Other visible sinuses, tympanic cavities, and mastoids appear clear. SOFT TISSUES AND SKULL: No acute soft tissue abnormality. No skull fracture. IMPRESSION: 1. Negative noncontrast CT appearance of the brain. 2. Left maxillary sinus mucosal thickening, significance doubtful. Electronically signed by: Helayne Hurst MD 11/29/2023 11:47 AM EDT RP  Workstation: [id number]     Procedures   Medications Ordered in the ED  ketorolac  (TORADOL ) 15 MG/ML injection 15 mg (has no administration in time range)  dexamethasone (DECADRON) injection 10 mg (has no administration in time range)  sodium chloride  0.9 % bolus 1,000 mL (0 mLs Intravenous Stopped 11/29/23 1124)  acetaminophen  (TYLENOL ) tablet 1,000 mg (1,000 mg Oral Given 11/29/23 1017)  ondansetron  (ZOFRAN ) injection 4 mg (4 mg Intravenous Given 11/29/23 1023)                                    Medical Decision Making Amount and/or Complexity of Data Reviewed Labs: ordered. Radiology: ordered.  Risk OTC drugs. Prescription drug management.   Christian Arias is here with headache and dizziness.  Normal vitals.  No fever.  History of seizures.  Overall very well-appearing.  Normal neurological exam.  Differential diagnosis could be vertigo type process versus electrolyte abnormality versus dehydration.  Have low suspicion for stroke or head bleed but will get a CT scan of his head.  He has no obvious nystagmus on exam.  He is got normal coordination.  Will test his ambulation after labs and images and medications.  He wants to avoid patient medications as he would like to drive home.  He does not describe this as the worst headache of his life.  There is no subarachnoid hemorrhage or aneurysm history in the family.  Denies any cocaine use.  Denies any infectious symptoms.  I have no concern for meningitis.  No neck pain.  No fever.  Could be migraine vertigo type process.  Lab work showed no significant leukocytosis anemia or electrolyte abnormality.  CT scan of the head is unremarkable.  He is feeling much better on reevaluation.  Ultimately had no concern for meningitis MS or other acute neurologic process.  Patient given Toradol  and Decadron as well.  I do suspect migraine type process may be a peripheral vertigo type process.  Ultimately continue supportive care at home.  Follow-up  with his primary care doctor which she has an appointment in about 2 weeks.  Told to return if you develop worsening pain including fever or neck stiffness or other concerning symptoms.  Discharge.  This chart was dictated using voice recognition software.  Despite best efforts to proofread,  errors can occur which can change the documentation meaning.      Final diagnoses:  Nonintractable headache, unspecified chronicity pattern, unspecified headache type  Dizziness    ED Discharge Orders     None          Ruthe Cornet, DO 11/29/23 1206

## 2023-11-29 NOTE — ED Provider Notes (Addendum)
 [facility]    CSN: [id number] Arrival date & time: 11/29/23  9175      History   Chief Complaint No chief complaint on file.   HPI Christian Arias is a 28 y.o. male with a past medical history of seizures presents for dizziness and headache.  Patient reports he awoke around 4 AM today with a frontal headache that he describes as a pounding headache.  He rates it as a 7 out of 10 and describes it as the worst headache of his life.  Denies history of headaches or migraines.  Denies thunderclap headache.  Denies first-degree relative with history of SAH.  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  He had weakness in his legs and felt like he was going to pass out and had tunnel vision.  Denies actual syncope.  He called his supervisor who advised to come to the emergency room.  He continues to report dizziness/feeling unsteady as well as weakness.  No ear pain, cough or congestion, shortness of breath.  No history of vertigo.  He states he is staying hydrated.  He also reports tingling in the posterior right foot x 1 week.  He has a new PCP appointment scheduled for October 20.  He has not taken anything for his headache.  No other concerns at this time.  HPI  Past Medical History:  Diagnosis Date   Seizures (HCC)     There are no active problems to display for this patient.   No past surgical history on file.     Home Medications    Prior to Admission medications   Medication Sig Start Date End Date Taking? Authorizing Provider  albuterol  (VENTOLIN  HFA) 108 (90 Base) MCG/ACT inhaler Inhale 2 puffs into the lungs every 4 (four) hours as needed for wheezing or shortness of breath. 02/24/22   Christopher Savannah, PA-C  cetirizine  (ZYRTEC  ALLERGY) 10 MG tablet Take 1 tablet (10 mg total) by mouth daily. 02/24/22   Christopher Savannah, PA-C  doxycycline  (VIBRAMYCIN ) 100 MG capsule Take 1  capsule (100 mg total) by mouth 2 (two) times daily. 11/11/23   Brilyn Tuller, Jodi R, NP  ondansetron  (ZOFRAN -ODT) 4 MG disintegrating tablet Take 1 tablet (4 mg total) by mouth every 8 (eight) hours as needed for nausea or vomiting. 11/06/23   Trachelle Low, Jodi R, NP  predniSONE  (DELTASONE ) 20 MG tablet Take 2 tablets daily with breakfast. 02/24/22   Christopher Savannah, PA-C  promethazine -dextromethorphan (PROMETHAZINE -DM) 6.25-15 MG/5ML syrup Take 5 mLs by mouth 3 (three) times daily as needed for cough. 02/24/22   Christopher Savannah, PA-C    Family History No family history on file.  Social History Social History   Tobacco Use   Smoking status: Every Day    Types: Cigars   Smokeless tobacco: Never  Vaping Use   Vaping status: Never Used  Substance Use Topics   Alcohol use: Yes    Comment: occ   Drug use: Not Currently     Allergies   Patient has no known allergies.   Review of Systems Review of Systems  Neurological:  Positive for dizziness, weakness and headaches.       Near syncope     Physical Exam Triage Vital Signs ED Triage Vitals  Encounter Vitals Group     BP 11/29/23 0850 102/67     Girls Systolic BP Percentile --  Girls Diastolic BP Percentile --      Boys Systolic BP Percentile --      Boys Diastolic BP Percentile --      Pulse Rate 11/29/23 0850 71     Resp 11/29/23 0850 16     Temp 11/29/23 0850 98 F (36.7 C)     Temp Source 11/29/23 0850 Oral     SpO2 11/29/23 0850 98 %     Weight --      Height --      Head Circumference --      Peak Flow --      Pain Score 11/29/23 0851 7     Pain Loc --      Pain Education --      Exclude from Growth Chart --    No data found.  Updated Vital Signs BP 102/67   Pulse 71   Temp 98 F (36.7 C) (Oral)   Resp 16   SpO2 98%   Visual Acuity Right Eye Distance:   Left Eye Distance:   Bilateral Distance:    Right Eye Near:   Left Eye Near:    Bilateral Near:     Physical Exam Vitals and nursing note reviewed.   Constitutional:      General: He is not in acute distress.    Appearance: Normal appearance. He is not ill-appearing.  HENT:     Head: Normocephalic and atraumatic.     Right Ear: Tympanic membrane and ear canal normal.     Left Ear: Tympanic membrane and ear canal normal.  Eyes:     Extraocular Movements: Extraocular movements intact.     Conjunctiva/sclera: Conjunctivae normal.     Pupils: Pupils are equal, round, and reactive to light.  Cardiovascular:     Rate and Rhythm: Normal rate.  Pulmonary:     Effort: Pulmonary effort is normal.  Skin:    General: Skin is warm and dry.  Neurological:     General: No focal deficit present.     Mental Status: He is alert and oriented to person, place, and time.     GCS: GCS eye subscore is 4. GCS verbal subscore is 5. GCS motor subscore is 6.     Cranial Nerves: No facial asymmetry.     Motor: No weakness.     Coordination: Romberg sign positive. Finger-Nose-Finger Test normal.     Gait: Tandem walk abnormal.  Psychiatric:        Mood and Affect: Mood normal.        Behavior: Behavior normal.      UC Treatments / Results  Labs (all labs ordered are listed, but only abnormal results are displayed) Labs Reviewed - No data to display  EKG   Radiology No results found.  Procedures Procedures (including critical care time)  Medications Ordered in UC Medications - No data to display  Initial Impression / Assessment and Plan / UC Course  I have reviewed the triage vital signs and the nursing notes.  Pertinent labs & imaging results that were available during my care of the patient were reviewed by me and considered in my medical decision making (see chart for details).     I reviewed exam and symptoms with patient.  Discussed limitations and abilities of urgent.  Patient presenting with worsening of life with new onset dizziness. I advised to go to the emergency room for further evaluation and treatment.  He is in agreement  with plan and will go  POV to the ER. Final Clinical Impressions(s) / UC Diagnoses   Final diagnoses:  Worst headache of life  Dizziness     Discharge Instructions      Please go to the ER for further evaluation of your symptoms    ED Prescriptions   None    PDMP not reviewed this encounter.   Loreda Myla SAUNDERS, NP 11/29/23 9072    Loreda Myla SAUNDERS, NP 11/29/23 [phone]

## 2023-12-12 ENCOUNTER — Ambulatory Visit: Payer: Self-pay
